# Patient Record
Sex: Female | Born: 1945 | Race: White | Hispanic: No | Smoking: Never smoker
Health system: Southern US, Community
[De-identification: ages and names within clinical notes are randomized; demographics above are authoritative.]

## PROBLEM LIST (undated history)

## (undated) DIAGNOSIS — R2689 Other abnormalities of gait and mobility: Secondary | ICD-10-CM

## (undated) DIAGNOSIS — K635 Polyp of colon: Secondary | ICD-10-CM

## (undated) DIAGNOSIS — C801 Malignant (primary) neoplasm, unspecified: Secondary | ICD-10-CM

## (undated) DIAGNOSIS — C4491 Basal cell carcinoma of skin, unspecified: Secondary | ICD-10-CM

## (undated) DIAGNOSIS — M19049 Primary osteoarthritis, unspecified hand: Secondary | ICD-10-CM

## (undated) DIAGNOSIS — E7800 Pure hypercholesterolemia, unspecified: Secondary | ICD-10-CM

## (undated) DIAGNOSIS — F419 Anxiety disorder, unspecified: Secondary | ICD-10-CM

## (undated) DIAGNOSIS — D509 Iron deficiency anemia, unspecified: Secondary | ICD-10-CM

## (undated) DIAGNOSIS — E669 Obesity, unspecified: Secondary | ICD-10-CM

## (undated) DIAGNOSIS — M1990 Unspecified osteoarthritis, unspecified site: Secondary | ICD-10-CM

## (undated) DIAGNOSIS — H9190 Unspecified hearing loss, unspecified ear: Secondary | ICD-10-CM

## (undated) DIAGNOSIS — K219 Gastro-esophageal reflux disease without esophagitis: Secondary | ICD-10-CM

## (undated) DIAGNOSIS — D649 Anemia, unspecified: Secondary | ICD-10-CM

## (undated) DIAGNOSIS — R011 Cardiac murmur, unspecified: Secondary | ICD-10-CM

## (undated) DIAGNOSIS — E559 Vitamin D deficiency, unspecified: Secondary | ICD-10-CM

## (undated) DIAGNOSIS — I10 Essential (primary) hypertension: Secondary | ICD-10-CM

## (undated) DIAGNOSIS — E119 Type 2 diabetes mellitus without complications: Secondary | ICD-10-CM

## (undated) DIAGNOSIS — E785 Hyperlipidemia, unspecified: Secondary | ICD-10-CM

## (undated) HISTORY — DX: Other abnormalities of gait and mobility: R26.89

## (undated) HISTORY — DX: Anemia, unspecified: D64.9

## (undated) HISTORY — DX: Iron deficiency anemia, unspecified: D50.9

## (undated) HISTORY — DX: Vitamin D deficiency, unspecified: E55.9

## (undated) HISTORY — DX: Pure hypercholesterolemia, unspecified: E78.00

## (undated) HISTORY — DX: Unspecified hearing loss, unspecified ear: H91.90

## (undated) HISTORY — DX: Malignant (primary) neoplasm, unspecified: C80.1

## (undated) HISTORY — DX: Primary osteoarthritis, unspecified hand: M19.049

## (undated) HISTORY — DX: Hyperlipidemia, unspecified: E78.5

## (undated) HISTORY — DX: Basal cell carcinoma of skin, unspecified: C44.91

## (undated) HISTORY — DX: Cardiac murmur, unspecified: R01.1

## (undated) HISTORY — DX: Obesity, unspecified: E66.9

## (undated) HISTORY — PX: OTHER SURGICAL HISTORY: SHX169

## (undated) HISTORY — PX: BREAST SURGERY: SHX581

## (undated) HISTORY — PX: ABDOMINAL HYSTERECTOMY: SHX81

## (undated) HISTORY — DX: Unspecified osteoarthritis, unspecified site: M19.90

## (undated) HISTORY — PX: APPENDECTOMY: SHX54

## (undated) HISTORY — DX: Anxiety disorder, unspecified: F41.9

## (undated) HISTORY — DX: Polyp of colon: K63.5

---

## 1975-12-29 HISTORY — PX: TOTAL ABDOMINAL HYSTERECTOMY: SHX209

## 1979-12-29 HISTORY — PX: BREAST CYST EXCISION: SHX579

## 2000-03-04 ENCOUNTER — Encounter: Payer: Self-pay | Admitting: Family Medicine

## 2000-03-04 ENCOUNTER — Encounter: Admission: RE | Admit: 2000-03-04 | Discharge: 2000-03-04 | Payer: Self-pay | Admitting: Family Medicine

## 2000-03-12 ENCOUNTER — Encounter: Payer: Self-pay | Admitting: Family Medicine

## 2000-03-12 ENCOUNTER — Encounter: Admission: RE | Admit: 2000-03-12 | Discharge: 2000-03-12 | Payer: Self-pay | Admitting: Family Medicine

## 2000-08-31 ENCOUNTER — Encounter: Admission: RE | Admit: 2000-08-31 | Discharge: 2000-08-31 | Payer: Self-pay | Admitting: Family Medicine

## 2000-08-31 ENCOUNTER — Encounter: Payer: Self-pay | Admitting: Family Medicine

## 2001-03-16 ENCOUNTER — Encounter: Payer: Self-pay | Admitting: Family Medicine

## 2001-03-16 ENCOUNTER — Encounter: Admission: RE | Admit: 2001-03-16 | Discharge: 2001-03-16 | Payer: Self-pay | Admitting: Family Medicine

## 2002-03-17 ENCOUNTER — Encounter: Payer: Self-pay | Admitting: Family Medicine

## 2002-03-17 ENCOUNTER — Encounter: Admission: RE | Admit: 2002-03-17 | Discharge: 2002-03-17 | Payer: Self-pay | Admitting: Family Medicine

## 2003-04-09 ENCOUNTER — Encounter: Admission: RE | Admit: 2003-04-09 | Discharge: 2003-04-09 | Payer: Self-pay | Admitting: Family Medicine

## 2003-04-09 ENCOUNTER — Encounter: Payer: Self-pay | Admitting: Family Medicine

## 2003-04-11 ENCOUNTER — Encounter: Payer: Self-pay | Admitting: Family Medicine

## 2003-04-11 ENCOUNTER — Encounter: Admission: RE | Admit: 2003-04-11 | Discharge: 2003-04-11 | Payer: Self-pay | Admitting: Family Medicine

## 2003-12-24 ENCOUNTER — Ambulatory Visit (HOSPITAL_COMMUNITY): Admission: RE | Admit: 2003-12-24 | Discharge: 2003-12-24 | Payer: Self-pay | Admitting: Gastroenterology

## 2003-12-24 ENCOUNTER — Encounter (INDEPENDENT_AMBULATORY_CARE_PROVIDER_SITE_OTHER): Payer: Self-pay | Admitting: *Deleted

## 2004-06-03 ENCOUNTER — Encounter: Admission: RE | Admit: 2004-06-03 | Discharge: 2004-06-03 | Payer: Self-pay | Admitting: Family Medicine

## 2005-06-18 ENCOUNTER — Encounter: Admission: RE | Admit: 2005-06-18 | Discharge: 2005-06-18 | Payer: Self-pay | Admitting: Family Medicine

## 2006-06-21 ENCOUNTER — Encounter: Admission: RE | Admit: 2006-06-21 | Discharge: 2006-06-21 | Payer: Self-pay | Admitting: Family Medicine

## 2007-06-23 ENCOUNTER — Encounter: Admission: RE | Admit: 2007-06-23 | Discharge: 2007-06-23 | Payer: Self-pay | Admitting: Family Medicine

## 2008-03-05 ENCOUNTER — Encounter: Admission: RE | Admit: 2008-03-05 | Discharge: 2008-03-05 | Payer: Self-pay | Admitting: Family Medicine

## 2008-06-25 ENCOUNTER — Encounter: Admission: RE | Admit: 2008-06-25 | Discharge: 2008-06-25 | Payer: Self-pay | Admitting: Family Medicine

## 2008-11-30 ENCOUNTER — Encounter: Admission: RE | Admit: 2008-11-30 | Discharge: 2008-11-30 | Payer: Self-pay | Admitting: Family Medicine

## 2009-06-26 ENCOUNTER — Encounter: Admission: RE | Admit: 2009-06-26 | Discharge: 2009-06-26 | Payer: Self-pay | Admitting: Family Medicine

## 2009-12-28 HISTORY — PX: KNEE SURGERY: SHX244

## 2010-06-27 ENCOUNTER — Encounter: Admission: RE | Admit: 2010-06-27 | Discharge: 2010-06-27 | Payer: Self-pay | Admitting: Family Medicine

## 2010-08-22 ENCOUNTER — Encounter: Admission: RE | Admit: 2010-08-22 | Discharge: 2010-08-22 | Payer: Self-pay | Admitting: Family Medicine

## 2011-05-15 NOTE — Op Note (Signed)
NAME:  ARTIA, SINGLEY                             ACCOUNT NO.:  [id number]   MEDICAL RECORD NO.:  [id number]                   PATIENT TYPE:  AMB   LOCATION:  ENDO                                 FACILITY:  [facility]   PHYSICIAN:  Petra Kuba, M.D.                 DATE OF BIRTH:  1946-10-25   DATE OF PROCEDURE:  12/24/2003  DATE OF DISCHARGE:                                 OPERATIVE REPORT   PROCEDURE:  Colonoscopy with polypectomy.   ENDOSCOPIST:  Petra Kuba, M.D.   INDICATIONS FOR PROCEDURE:  A family history of colon polyps.  Due for a  colonic screening.  A consent was signed after the risks, benefits, methods  and options were thoroughly discussed in the office.   MEDICINES USED:  Demerol 70 mg, Versed 5 mg.   DESCRIPTION OF PROCEDURE:  A rectal inspection is pertinent for external  hemorrhoids, small.  A digital examination was negative.  A video pediatric  adjustable colonoscope was inserted and fairly easily advanced around the  colon to the cecum.  This did require rolling her on her back and some  abdominal pressure.  On insertion no obvious abnormality was seen.  The  cecum was identified by the appendiceal orifice and the ileocecal valve.  The prep was adequate.  There was some liquid stool that required washing  and suctioning.  On a slow withdrawal through the colon, there were three  transverse and three descending small polyps, which were all hot biopsied x1  or 2, and put in the same container.  There were some scattered left-sided  diverticula, but no other abnormalities, as we slowly withdrew back to the  rectum.  An anal rectal pull-through in retroflexion confirmed some small  hemorrhoids.  The scope was reinserted a short ways up the left side of the  colon.  Air and water were suctioned.  The scope was removed.  The patient tolerated the procedure well.  There was no obvious immediate  complication.   ENDOSCOPIC ASSESSMENT:  1. Internal and external  hemorrhoids.  2. Left-sided diverticula.  3. Six small descending and transverse polyps, all hot biopsied 1-2x.  4. Otherwise within normal limits to the cecum.   PLAN:  Await pathology to determine future colonic screening.  Have to see  back p.r.n., otherwise return care to Dr. Kevan Ny for the customary health  care maintenance, to include yearly rectals and guaiacs.                                               Petra Kuba, M.D.    MEM/MEDQ  D:  12/24/2003  T:  12/24/2003  Job:  [id number]   cc:   Dr. Kevan Ny

## 2011-07-20 ENCOUNTER — Other Ambulatory Visit: Payer: Self-pay | Admitting: Family Medicine

## 2011-07-20 DIAGNOSIS — Z1231 Encounter for screening mammogram for malignant neoplasm of breast: Secondary | ICD-10-CM

## 2011-07-24 ENCOUNTER — Ambulatory Visit
Admission: RE | Admit: 2011-07-24 | Discharge: 2011-07-24 | Disposition: A | Discharge status: Home / Self Care | Payer: BC Managed Care – PPO | Source: Ambulatory Visit | Attending: Family Medicine | Admitting: Family Medicine

## 2011-07-24 DIAGNOSIS — Z1231 Encounter for screening mammogram for malignant neoplasm of breast: Secondary | ICD-10-CM

## 2011-09-01 ENCOUNTER — Other Ambulatory Visit: Payer: Self-pay | Admitting: Gastroenterology

## 2012-07-12 ENCOUNTER — Other Ambulatory Visit: Payer: Self-pay | Admitting: Family Medicine

## 2012-07-12 DIAGNOSIS — Z1231 Encounter for screening mammogram for malignant neoplasm of breast: Secondary | ICD-10-CM

## 2012-07-15 ENCOUNTER — Ambulatory Visit
Admission: RE | Admit: 2012-07-15 | Discharge: 2012-07-15 | Disposition: A | Discharge status: Home / Self Care | Payer: Medicare Other | Source: Ambulatory Visit | Attending: Family Medicine | Admitting: Family Medicine

## 2012-07-15 DIAGNOSIS — Z1231 Encounter for screening mammogram for malignant neoplasm of breast: Secondary | ICD-10-CM

## 2012-11-09 NOTE — H&P (Signed)
TOTAL KNEE ADMISSION H&P  Patient is being admitted for left total knee arthroplasty.  Subjective:  Chief Complaint:left knee pain.  HPI: Margaret Zhang, 66 y.o. female, has a history of pain and functional disability in the left knee due to arthritis and has failed non-surgical conservative treatments for greater than 12 weeks to includeNSAID's and/or analgesics, corticosteriod injections, viscosupplementation injections, flexibility and strengthening excercises and activity modification.  Onset of symptoms was gradual, starting 3 years ago with gradually worsening course since that time. The patient noted prior procedures on the knee to include  arthroscopy and menisectomy on the left knee(s).  Patient currently rates pain in the left knee(s) at 7 out of 10 with activity. Patient has night pain, worsening of pain with activity and weight bearing, pain that interferes with activities of daily living, pain with passive range of motion, crepitus and joint swelling.  Patient has evidence of periarticular osteophytes and joint space narrowing by imaging studies.  There is no active infection.  Medical History Tinnitus Vertigo Pneumonia Hypertension Hypercholesteremia Hiatal hernia Osteoarthritis Diabetes mellitus type II  Surgical History Lumpectomy right breast 1981 Hysterectomy with appendectomy 1978 Left knee arthroscopy 2011  Medications Vivelle dot 0.05mg  patch change twice a week Multi-vitamin for women 50+ daily Fish oil 1000mg  TID Lisinopril-HCTZ 20-25mg  daily Amolodipine besylate 5mg  daily Atorvastatin 80mg  daily Vitamin D2 1.25mg  weekly Metformin 500mg  TID  NKDA  History  Substance Use Topics  . Smoking status: Never  . Smokeless tobacco: None  . Alcohol Use: None   Family History Father deceased age 26 due to lung cancer Mother living age 87- HTN  Review of Systems  Constitutional: Positive for malaise/fatigue. Negative for fever, chills, weight loss and  diaphoresis.  HENT: Positive for tinnitus. Negative for hearing loss, ear pain, nosebleeds, congestion, sore throat, neck pain and ear discharge.   Eyes: Negative.   Respiratory: Negative.  Negative for stridor.   Cardiovascular: Negative.   Gastrointestinal: Negative.   Genitourinary: Negative.   Musculoskeletal: Positive for joint pain. Negative for myalgias, back pain and falls.       Left knee pain  Skin: Negative.   Neurological: Negative.  Negative for weakness and headaches.  Endo/Heme/Allergies: Negative.   Psychiatric/Behavioral: Negative.     Objective:  Physical Exam  Constitutional: She is oriented to person, place, and time. She appears well-developed and well-nourished. No distress.  HENT:  Head: Normocephalic and atraumatic.  Right Ear: External ear normal.  Left Ear: External ear normal.  Nose: Nose normal.  Mouth/Throat: Oropharynx is clear and moist.  Eyes: Conjunctivae normal and EOM are normal.  Neck: Normal range of motion. No tracheal deviation present. No thyromegaly present.  Cardiovascular: Normal rate, regular rhythm and intact distal pulses.   No murmur heard. Respiratory: Effort normal and breath sounds normal. No respiratory distress. She has no wheezes. She exhibits no tenderness.  GI: Soft. Bowel sounds are normal. She exhibits no distension and no mass. There is no tenderness.  Musculoskeletal:       Right hip: Normal.       Left hip: Normal.       Right knee: Normal.       Left knee: She exhibits decreased range of motion and swelling. She exhibits no erythema. tenderness found. Medial joint line tenderness noted.       Legs: Lymphadenopathy:    She has no cervical adenopathy.  Neurological: She is alert and oriented to person, place, and time. She has normal strength. No sensory deficit.  Skin: No rash noted. She is not diaphoretic. No erythema.  Psychiatric: She has a normal mood and affect.    Vitals Weight: 198 lb Height: 62  in Body Surface Area: 1.98 m Body Mass Index: 36.21 kg/m Pulse: 65 (Regular) BP: 139/67 (Sitting, Left Arm, Standard)   Imaging Review Plain radiographs demonstrate severe degenerative joint disease of the left knee(s). The overall alignment ismild varus. The bone quality appears to be fair for age and reported activity level.  Assessment/Plan:  End stage arthritis, left knee   The patient history, physical examination, clinical judgment of the provider and imaging studies are consistent with end stage degenerative joint disease of the left knee(s) and total knee arthroplasty is deemed medically necessary. The treatment options including medical management, injection therapy arthroscopy and arthroplasty were discussed at length. The risks and benefits of total knee arthroplasty were presented and reviewed. The risks due to aseptic loosening, infection, stiffness, patella tracking problems, thromboembolic complications and other imponderables were discussed. The patient acknowledged the explanation, agreed to proceed with the plan and consent was signed. Patient is being admitted for inpatient treatment for surgery, pain control, PT, OT, prophylactic antibiotics, VTE prophylaxis, progressive ambulation and ADL's and discharge planning. The patient is planning to be discharged home with home health services    [place], [place]

## 2012-11-15 ENCOUNTER — Encounter (HOSPITAL_COMMUNITY): Payer: Self-pay | Admitting: Pharmacy Technician

## 2012-11-16 ENCOUNTER — Other Ambulatory Visit (HOSPITAL_COMMUNITY): Payer: Self-pay | Admitting: Surgical

## 2012-11-16 ENCOUNTER — Other Ambulatory Visit (HOSPITAL_COMMUNITY): Payer: Self-pay | Admitting: Orthopedic Surgery

## 2012-11-17 ENCOUNTER — Encounter (HOSPITAL_COMMUNITY)
Admission: RE | Admit: 2012-11-17 | Discharge: 2012-11-17 | Disposition: A | Discharge status: Home / Self Care | Payer: Medicare Other | Source: Ambulatory Visit | Attending: Orthopedic Surgery | Admitting: Orthopedic Surgery

## 2012-11-17 ENCOUNTER — Encounter (HOSPITAL_COMMUNITY): Payer: Self-pay

## 2012-11-17 ENCOUNTER — Ambulatory Visit (HOSPITAL_COMMUNITY)
Admission: RE | Admit: 2012-11-17 | Discharge: 2012-11-17 | Disposition: A | Discharge status: Home / Self Care | Payer: Medicare Other | Source: Ambulatory Visit | Attending: Surgical | Admitting: Surgical

## 2012-11-17 DIAGNOSIS — Z01812 Encounter for preprocedural laboratory examination: Secondary | ICD-10-CM | POA: Insufficient documentation

## 2012-11-17 DIAGNOSIS — Z01818 Encounter for other preprocedural examination: Secondary | ICD-10-CM | POA: Insufficient documentation

## 2012-11-17 HISTORY — DX: Type 2 diabetes mellitus without complications: E11.9

## 2012-11-17 HISTORY — DX: Gastro-esophageal reflux disease without esophagitis: K21.9

## 2012-11-17 HISTORY — DX: Essential (primary) hypertension: I10

## 2012-11-17 LAB — URINALYSIS, ROUTINE W REFLEX MICROSCOPIC
Bilirubin Urine: NEGATIVE
Glucose, UA: NEGATIVE mg/dL
Hgb urine dipstick: NEGATIVE
Ketones, ur: NEGATIVE mg/dL
Leukocytes, UA: NEGATIVE
Nitrite: NEGATIVE
Protein, ur: NEGATIVE mg/dL
Specific Gravity, Urine: 1.022 (ref 1.005–1.030)
Urobilinogen, UA: 0.2 mg/dL (ref 0.0–1.0)
pH: 5 (ref 5.0–8.0)

## 2012-11-17 LAB — COMPREHENSIVE METABOLIC PANEL
ALT: 19 U/L (ref 0–35)
AST: 19 U/L (ref 0–37)
Albumin: 3.3 g/dL — ABNORMAL LOW (ref 3.5–5.2)
Alkaline Phosphatase: 72 U/L (ref 39–117)
BUN: 13 mg/dL (ref 6–23)
CO2: 27 mEq/L (ref 19–32)
Calcium: 9.4 mg/dL (ref 8.4–10.5)
Chloride: 103 mEq/L (ref 96–112)
Creatinine, Ser: 0.58 mg/dL (ref 0.50–1.10)
GFR calc Af Amer: >90 mL/min (ref >90)
GFR calc non Af Amer: >90 mL/min (ref >90)
Glucose, Bld: 133 mg/dL — ABNORMAL HIGH (ref 70–99)
Potassium: 4.2 mEq/L (ref 3.5–5.1)
Sodium: 139 mEq/L (ref 135–145)
Total Bilirubin: 0.3 mg/dL (ref 0.3–1.2)
Total Protein: 6.5 g/dL (ref 6.0–8.3)

## 2012-11-17 LAB — CBC
Hemoglobin: 11.7 g/dL — ABNORMAL LOW (ref 12.0–15.0)
MCHC: 32.5 g/dL (ref 30.0–36.0)
RBC: 4.28 MIL/uL (ref 3.87–5.11)

## 2012-11-17 LAB — PROTIME-INR
INR: 0.91 (ref 0.00–1.49)
Prothrombin Time: 12.2 seconds (ref 11.6–15.2)

## 2012-11-17 LAB — SURGICAL PCR SCREEN
MRSA, PCR: NEGATIVE
Staphylococcus aureus: NEGATIVE

## 2012-11-17 LAB — APTT: aPTT: 36 seconds (ref 24–37)

## 2012-11-17 NOTE — Patient Instructions (Signed)
20      Your procedure is scheduled on:  Wednesday 11/23/2012 at 0730 am  Report to [facility] at 0530 AM.  Call this number if you have problems the morning of surgery: [phone]   Remember:   Do not eat food or drink liquids after midnight!  Take these medicines the morning of surgery with A SIP OF WATER: Norvasc, Prilosec   Do not bring valuables to the hospital.  .  Leave suitcase in the car. After surgery it may be brought to your room.  For patients admitted to the hospital, checkout time is 11:00 AM the day of              Discharge.    Special Instructions: See [facility] Preparing  For Surgery Instruction Sheet. Do not wear jewelry, lotions powders, perfumes. Women do not shave legs or underarms for 12 hours before showers. Contacts, partial plates, or dentures may not be worn into surgery.                          Patients discharged the day of surgery will not be allowed to drive home. If going home the same day of surgery, must have someone stay with you                first 24 hrs.at home and arrange for someone to drive you home from the              Hospital. YOUR DRIVER IS: Son- Jomarie Longs   Please read over the following fact sheets that you were given: MRSA INFORMATION,INCENTIVE SPIROMETRY SHEET, SLEEP APNEA SHEET, BLOOD TRANSFUSION SHEET                            Telford Nab.Dearius Hoffmann,RN,BSN     [phone]

## 2012-11-22 NOTE — Anesthesia Preprocedure Evaluation (Addendum)
Anesthesia Evaluation  Patient identified by MRN, date of birth, ID band Patient awake    Reviewed: Allergy & Precautions, H&P , NPO status , Patient's Chart, lab work & pertinent test results  Airway Mallampati: II TM Distance: >3 FB Neck ROM: full    Dental No notable dental hx. (+) Teeth Intact and Dental Advisory Given   Pulmonary neg pulmonary ROS,  breath sounds clear to auscultation  Pulmonary exam normal       Cardiovascular Exercise Tolerance: Good hypertension, Pt. on medications Rhythm:regular Rate:Normal     Neuro/Psych negative neurological ROS  negative psych ROS   GI/Hepatic negative GI ROS, Neg liver ROS, GERD-  Medicated and Controlled,  Endo/Other  diabetes, Well Controlled, Type 2, Oral Hypoglycemic Agents  Renal/GU negative Renal ROS  negative genitourinary   Musculoskeletal   Abdominal   Peds  Hematology negative hematology ROS (+)   Anesthesia Other Findings   Reproductive/Obstetrics negative OB ROS                          Anesthesia Physical Anesthesia Plan  ASA: III  Anesthesia Plan: General   Post-op Pain Management:    Induction: Intravenous  Airway Management Planned: Oral ETT  Additional Equipment:   Intra-op Plan:   Post-operative Plan: Extubation in OR  Informed Consent: I have reviewed the patients History and Physical, chart, labs and discussed the procedure including the risks, benefits and alternatives for the proposed anesthesia with the patient or authorized representative who has indicated his/her understanding and acceptance.   Dental Advisory Given  Plan Discussed with: CRNA and Surgeon  Anesthesia Plan Comments:         Anesthesia Quick Evaluation  

## 2012-11-23 ENCOUNTER — Ambulatory Visit (HOSPITAL_COMMUNITY): Payer: Medicare Other | Admitting: Anesthesiology

## 2012-11-23 ENCOUNTER — Inpatient Hospital Stay (HOSPITAL_COMMUNITY)
Admission: RE | Admit: 2012-11-23 | Discharge: 2012-11-28 | DRG: 470 | Disposition: A | Discharge status: Skilled Nursing Facility | Payer: Medicare Other | Source: Ambulatory Visit | Attending: Orthopedic Surgery | Admitting: Orthopedic Surgery

## 2012-11-23 ENCOUNTER — Encounter (HOSPITAL_COMMUNITY)
Admission: RE | Disposition: A | Discharge status: Skilled Nursing Facility | Payer: Self-pay | Source: Ambulatory Visit | Attending: Orthopedic Surgery

## 2012-11-23 ENCOUNTER — Encounter (HOSPITAL_COMMUNITY): Payer: Self-pay | Admitting: *Deleted

## 2012-11-23 ENCOUNTER — Encounter (HOSPITAL_COMMUNITY): Payer: Self-pay | Admitting: Anesthesiology

## 2012-11-23 ENCOUNTER — Ambulatory Visit (HOSPITAL_COMMUNITY): Payer: Medicare Other

## 2012-11-23 DIAGNOSIS — Z79899 Other long term (current) drug therapy: Secondary | ICD-10-CM

## 2012-11-23 DIAGNOSIS — E119 Type 2 diabetes mellitus without complications: Secondary | ICD-10-CM | POA: Diagnosis present

## 2012-11-23 DIAGNOSIS — E871 Hypo-osmolality and hyponatremia: Secondary | ICD-10-CM

## 2012-11-23 DIAGNOSIS — I10 Essential (primary) hypertension: Secondary | ICD-10-CM | POA: Diagnosis present

## 2012-11-23 DIAGNOSIS — K219 Gastro-esophageal reflux disease without esophagitis: Secondary | ICD-10-CM | POA: Diagnosis present

## 2012-11-23 DIAGNOSIS — E876 Hypokalemia: Secondary | ICD-10-CM

## 2012-11-23 DIAGNOSIS — M1710 Unilateral primary osteoarthritis, unspecified knee: Principal | ICD-10-CM | POA: Diagnosis present

## 2012-11-23 DIAGNOSIS — M179 Osteoarthritis of knee, unspecified: Secondary | ICD-10-CM | POA: Diagnosis present

## 2012-11-23 DIAGNOSIS — D62 Acute posthemorrhagic anemia: Secondary | ICD-10-CM

## 2012-11-23 DIAGNOSIS — E7800 Pure hypercholesterolemia, unspecified: Secondary | ICD-10-CM | POA: Diagnosis present

## 2012-11-23 HISTORY — PX: TOTAL KNEE ARTHROPLASTY: SHX125

## 2012-11-23 LAB — GLUCOSE, CAPILLARY
Glucose-Capillary: 235 mg/dL — ABNORMAL HIGH (ref 70–99)
Glucose-Capillary: 187 mg/dL — ABNORMAL HIGH (ref 70–99)

## 2012-11-23 LAB — TYPE AND SCREEN
ABO/RH(D): A NEG
Antibody Screen: NEGATIVE

## 2012-11-23 SURGERY — ARTHROPLASTY, KNEE, TOTAL
Anesthesia: General | Site: Knee | Laterality: Left | Wound class: Clean

## 2012-11-23 MED ORDER — MENTHOL 3 MG MT LOZG
1.0000 | LOZENGE | OROMUCOSAL | Status: DC | PRN
  Filled 2012-11-23: qty 9

## 2012-11-23 MED ORDER — ONDANSETRON HCL 4 MG/2ML IJ SOLN
INTRAMUSCULAR | Status: DC | PRN
  Administered 2012-11-23: 4 mg via INTRAVENOUS

## 2012-11-23 MED ORDER — NEOSTIGMINE METHYLSULFATE 1 MG/ML IJ SOLN
INTRAMUSCULAR | Status: DC | PRN
  Administered 2012-11-23: 3 mg via INTRAVENOUS

## 2012-11-23 MED ORDER — ONDANSETRON HCL 4 MG/2ML IJ SOLN
4.0000 mg | Freq: Four times a day (QID) | INTRAMUSCULAR | Status: DC | PRN
  Administered 2012-11-23: 4 mg via INTRAVENOUS
  Filled 2012-11-23: qty 2

## 2012-11-23 MED ORDER — METHOCARBAMOL 500 MG PO TABS
500.0000 mg | ORAL_TABLET | Freq: Four times a day (QID) | ORAL | Status: DC | PRN
  Administered 2012-11-24 – 2012-11-28 (×11): 500 mg via ORAL
  Filled 2012-11-23 (×11): qty 1

## 2012-11-23 MED ORDER — MIDAZOLAM HCL 5 MG/5ML IJ SOLN
INTRAMUSCULAR | Status: DC | PRN
  Administered 2012-11-23: 2 mg via INTRAVENOUS

## 2012-11-23 MED ORDER — HEMOSTATIC AGENTS (NO CHARGE) OPTIME
TOPICAL | Status: DC | PRN
  Administered 2012-11-23: 1 via TOPICAL

## 2012-11-23 MED ORDER — SODIUM CHLORIDE 0.9 % IR SOLN
Status: DC | PRN
  Administered 2012-11-23: 08:00:00

## 2012-11-23 MED ORDER — ACETAMINOPHEN 325 MG PO TABS
650.0000 mg | ORAL_TABLET | Freq: Four times a day (QID) | ORAL | Status: DC | PRN
  Administered 2012-11-24: 650 mg via ORAL
  Filled 2012-11-23 (×2): qty 2

## 2012-11-23 MED ORDER — HYDROMORPHONE HCL PF 1 MG/ML IJ SOLN
1.0000 mg | INTRAMUSCULAR | Status: DC | PRN
  Administered 2012-11-23 – 2012-11-24 (×8): 1 mg via INTRAVENOUS
  Filled 2012-11-23 (×8): qty 1

## 2012-11-23 MED ORDER — OMEPRAZOLE MAGNESIUM 20 MG PO TBEC: 20.0000 mg | DELAYED_RELEASE_TABLET | Freq: Every day | ORAL | Status: DC

## 2012-11-23 MED ORDER — GLYCOPYRROLATE 0.2 MG/ML IJ SOLN
INTRAMUSCULAR | Status: DC | PRN
  Administered 2012-11-23: 0.4 mg via INTRAVENOUS

## 2012-11-23 MED ORDER — SODIUM CHLORIDE 0.9 % IJ SOLN
INTRAMUSCULAR | Status: DC | PRN
  Administered 2012-11-23: 20 mL

## 2012-11-23 MED ORDER — ACETAMINOPHEN 10 MG/ML IV SOLN
INTRAVENOUS | Status: AC
  Filled 2012-11-23: qty 100

## 2012-11-23 MED ORDER — PANTOPRAZOLE SODIUM 40 MG PO TBEC
40.0000 mg | DELAYED_RELEASE_TABLET | Freq: Every day | ORAL | Status: DC
  Administered 2012-11-23 – 2012-11-28 (×6): 40 mg via ORAL
  Filled 2012-11-23 (×6): qty 1

## 2012-11-23 MED ORDER — THROMBIN 5000 UNITS EX SOLR
CUTANEOUS | Status: DC | PRN
  Administered 2012-11-23: 5000 [IU] via TOPICAL

## 2012-11-23 MED ORDER — ALUM & MAG HYDROXIDE-SIMETH 200-200-20 MG/5ML PO SUSP: 30.0000 mL | ORAL | Status: DC | PRN

## 2012-11-23 MED ORDER — INSULIN ASPART 100 UNIT/ML ~~LOC~~ SOLN
0.0000 [IU] | Freq: Three times a day (TID) | SUBCUTANEOUS | Status: DC
  Administered 2012-11-23: 5 [IU] via SUBCUTANEOUS
  Administered 2012-11-24 (×3): 3 [IU] via SUBCUTANEOUS
  Administered 2012-11-25 – 2012-11-26 (×5): 2 [IU] via SUBCUTANEOUS
  Administered 2012-11-27: 3 [IU] via SUBCUTANEOUS
  Administered 2012-11-27: 2 [IU] via SUBCUTANEOUS
  Administered 2012-11-27: 3 [IU] via SUBCUTANEOUS
  Administered 2012-11-28 (×2): 2 [IU] via SUBCUTANEOUS

## 2012-11-23 MED ORDER — CEFAZOLIN SODIUM 1-5 GM-% IV SOLN
1.0000 g | Freq: Four times a day (QID) | INTRAVENOUS | Status: AC
  Administered 2012-11-23 (×2): 1 g via INTRAVENOUS
  Filled 2012-11-23 (×4): qty 50

## 2012-11-23 MED ORDER — CEFAZOLIN SODIUM-DEXTROSE 2-3 GM-% IV SOLR
INTRAVENOUS | Status: AC
  Filled 2012-11-23: qty 50

## 2012-11-23 MED ORDER — DEXAMETHASONE SODIUM PHOSPHATE 10 MG/ML IJ SOLN
INTRAMUSCULAR | Status: DC | PRN
  Administered 2012-11-23: 10 mg via INTRAVENOUS

## 2012-11-23 MED ORDER — VITAMINS A & D EX OINT
TOPICAL_OINTMENT | CUTANEOUS | Status: AC
  Administered 2012-11-23: 5
  Filled 2012-11-23: qty 5

## 2012-11-23 MED ORDER — LACTATED RINGERS IV SOLN
INTRAVENOUS | Status: DC
  Administered 2012-11-23 – 2012-11-24 (×2): via INTRAVENOUS

## 2012-11-23 MED ORDER — HYDROCODONE-ACETAMINOPHEN 5-325 MG PO TABS
1.0000 | ORAL_TABLET | ORAL | Status: DC | PRN
  Administered 2012-11-24: 2 via ORAL
  Filled 2012-11-23: qty 2
  Filled 2012-11-23: qty 1

## 2012-11-23 MED ORDER — HYDROMORPHONE HCL PF 1 MG/ML IJ SOLN: 0.2500 mg | INTRAMUSCULAR | Status: DC | PRN

## 2012-11-23 MED ORDER — LACTATED RINGERS IV SOLN
INTRAVENOUS | Status: DC | PRN
  Administered 2012-11-23 (×2): via INTRAVENOUS

## 2012-11-23 MED ORDER — THROMBIN 5000 UNITS EX SOLR
CUTANEOUS | Status: AC
  Filled 2012-11-23: qty 5000

## 2012-11-23 MED ORDER — SODIUM CHLORIDE 0.9 % IR SOLN
Status: DC | PRN
  Administered 2012-11-23: 3000 mL

## 2012-11-23 MED ORDER — OXYCODONE-ACETAMINOPHEN 5-325 MG PO TABS
2.0000 | ORAL_TABLET | ORAL | Status: DC | PRN
  Administered 2012-11-24 (×2): 2 via ORAL
  Filled 2012-11-23 (×3): qty 2

## 2012-11-23 MED ORDER — AMLODIPINE BESYLATE 5 MG PO TABS
5.0000 mg | ORAL_TABLET | Freq: Every day | ORAL | Status: DC
  Administered 2012-11-24 – 2012-11-28 (×3): 5 mg via ORAL
  Filled 2012-11-23 (×6): qty 1

## 2012-11-23 MED ORDER — HYDROMORPHONE HCL PF 1 MG/ML IJ SOLN
INTRAMUSCULAR | Status: DC | PRN
  Administered 2012-11-23: 2 mg via INTRAVENOUS

## 2012-11-23 MED ORDER — RIVAROXABAN 10 MG PO TABS
10.0000 mg | ORAL_TABLET | Freq: Every day | ORAL | Status: DC
  Administered 2012-11-24 – 2012-11-28 (×5): 10 mg via ORAL
  Filled 2012-11-23 (×7): qty 1

## 2012-11-23 MED ORDER — LACTATED RINGERS IV SOLN: INTRAVENOUS | Status: DC

## 2012-11-23 MED ORDER — METFORMIN HCL 500 MG PO TABS
1000.0000 mg | ORAL_TABLET | Freq: Every day | ORAL | Status: DC
  Administered 2012-11-24 – 2012-11-28 (×5): 1000 mg via ORAL
  Filled 2012-11-23 (×6): qty 2

## 2012-11-23 MED ORDER — BISACODYL 10 MG RE SUPP: 10.0000 mg | Freq: Every day | RECTAL | Status: DC | PRN

## 2012-11-23 MED ORDER — LIDOCAINE HCL (CARDIAC) 20 MG/ML IV SOLN
INTRAVENOUS | Status: DC | PRN
  Administered 2012-11-23: 50 mg via INTRAVENOUS

## 2012-11-23 MED ORDER — METHOCARBAMOL 100 MG/ML IJ SOLN
500.0000 mg | Freq: Four times a day (QID) | INTRAVENOUS | Status: DC | PRN
  Administered 2012-11-23: 500 mg via INTRAVENOUS
  Filled 2012-11-23: qty 5

## 2012-11-23 MED ORDER — FERROUS SULFATE 325 (65 FE) MG PO TABS
325.0000 mg | ORAL_TABLET | Freq: Three times a day (TID) | ORAL | Status: DC
  Administered 2012-11-23 – 2012-11-27 (×11): 325 mg via ORAL
  Filled 2012-11-23 (×18): qty 1

## 2012-11-23 MED ORDER — POLYETHYLENE GLYCOL 3350 17 G PO PACK: 17.0000 g | PACK | Freq: Every day | ORAL | Status: DC | PRN

## 2012-11-23 MED ORDER — BUPIVACAINE LIPOSOME 1.3 % IJ SUSP
20.0000 mL | Freq: Once | INTRAMUSCULAR | Status: AC
  Administered 2012-11-23: 20 mL
  Filled 2012-11-23: qty 20

## 2012-11-23 MED ORDER — METFORMIN HCL 500 MG PO TABS
500.0000 mg | ORAL_TABLET | Freq: Every day | ORAL | Status: DC
  Administered 2012-11-23 – 2012-11-27 (×5): 500 mg via ORAL
  Filled 2012-11-23 (×6): qty 1

## 2012-11-23 MED ORDER — SUCCINYLCHOLINE CHLORIDE 20 MG/ML IJ SOLN
INTRAMUSCULAR | Status: DC | PRN
  Administered 2012-11-23: 100 mg via INTRAVENOUS

## 2012-11-23 MED ORDER — CEFAZOLIN SODIUM-DEXTROSE 2-3 GM-% IV SOLR
2.0000 g | INTRAVENOUS | Status: AC
  Administered 2012-11-23: 2 g via INTRAVENOUS

## 2012-11-23 MED ORDER — HYDROCHLOROTHIAZIDE 25 MG PO TABS
25.0000 mg | ORAL_TABLET | Freq: Every day | ORAL | Status: DC
  Administered 2012-11-23 – 2012-11-28 (×4): 25 mg via ORAL
  Filled 2012-11-23 (×6): qty 1

## 2012-11-23 MED ORDER — PROPOFOL 10 MG/ML IV BOLUS
INTRAVENOUS | Status: DC | PRN
  Administered 2012-11-23: 170 mg via INTRAVENOUS

## 2012-11-23 MED ORDER — ESTRADIOL 0.05 MG/24HR TD PTWK
0.0500 mg | MEDICATED_PATCH | TRANSDERMAL | Status: DC
  Administered 2012-11-24: 0.05 mg via TRANSDERMAL
  Filled 2012-11-23: qty 1

## 2012-11-23 MED ORDER — ATORVASTATIN CALCIUM 80 MG PO TABS
80.0000 mg | ORAL_TABLET | Freq: Every day | ORAL | Status: DC
  Administered 2012-11-24 – 2012-11-27 (×4): 80 mg via ORAL
  Filled 2012-11-23 (×5): qty 1

## 2012-11-23 MED ORDER — ROCURONIUM BROMIDE 100 MG/10ML IV SOLN
INTRAVENOUS | Status: DC | PRN
  Administered 2012-11-23: 20 mg via INTRAVENOUS

## 2012-11-23 MED ORDER — LISINOPRIL 20 MG PO TABS
20.0000 mg | ORAL_TABLET | Freq: Every day | ORAL | Status: DC
  Administered 2012-11-23 – 2012-11-28 (×3): 20 mg via ORAL
  Filled 2012-11-23 (×6): qty 1

## 2012-11-23 MED ORDER — ONDANSETRON HCL 4 MG PO TABS
4.0000 mg | ORAL_TABLET | Freq: Four times a day (QID) | ORAL | Status: DC | PRN
  Administered 2012-11-27: 4 mg via ORAL
  Filled 2012-11-23: qty 1

## 2012-11-23 MED ORDER — ACETAMINOPHEN 650 MG RE SUPP: 650.0000 mg | Freq: Four times a day (QID) | RECTAL | Status: DC | PRN

## 2012-11-23 MED ORDER — PHENOL 1.4 % MT LIQD
1.0000 | OROMUCOSAL | Status: DC | PRN
  Filled 2012-11-23: qty 177

## 2012-11-23 MED ORDER — LISINOPRIL-HYDROCHLOROTHIAZIDE 20-25 MG PO TABS: 1.0000 | ORAL_TABLET | Freq: Every day | ORAL | Status: DC

## 2012-11-23 MED ORDER — FENTANYL CITRATE 0.05 MG/ML IJ SOLN
INTRAMUSCULAR | Status: DC | PRN
  Administered 2012-11-23: 100 ug via INTRAVENOUS

## 2012-11-23 MED ORDER — ACETAMINOPHEN 10 MG/ML IV SOLN
INTRAVENOUS | Status: DC | PRN
  Administered 2012-11-23: 1000 mg via INTRAVENOUS

## 2012-11-23 MED ORDER — FLEET ENEMA 7-19 GM/118ML RE ENEM: 1.0000 | ENEMA | Freq: Once | RECTAL | Status: AC | PRN

## 2012-11-23 SURGICAL SUPPLY — 67 items
BAG SPEC THK2 15X12 ZIP CLS (MISCELLANEOUS) ×1
BAG ZIPLOCK 12X15 (MISCELLANEOUS) ×2 IMPLANT
BANDAGE ELASTIC 4 VELCRO ST LF (GAUZE/BANDAGES/DRESSINGS) ×2 IMPLANT
BANDAGE ELASTIC 6 VELCRO ST LF (GAUZE/BANDAGES/DRESSINGS) ×2 IMPLANT
BANDAGE ESMARK 6X9 LF (GAUZE/BANDAGES/DRESSINGS) ×1 IMPLANT
BANDAGE GAUZE ELAST BULKY 4 IN (GAUZE/BANDAGES/DRESSINGS) ×2 IMPLANT
BLADE SAG 18X100X1.27 (BLADE) ×2 IMPLANT
BLADE SAW SGTL 11.0X1.19X90.0M (BLADE) ×2 IMPLANT
BNDG CMPR 9X6 STRL LF SNTH (GAUZE/BANDAGES/DRESSINGS) ×1
BNDG ESMARK 6X9 LF (GAUZE/BANDAGES/DRESSINGS) ×2
BONE CEMENT GENTAMICIN (Cement) ×4 IMPLANT
CEMENT BONE GENTAMICIN 40 (Cement) ×2 IMPLANT
CLOTH BEACON ORANGE TIMEOUT ST (SAFETY) ×2 IMPLANT
CUFF TOURN SGL QUICK 34 (TOURNIQUET CUFF) ×2
CUFF TRNQT CYL 34X4X40X1 (TOURNIQUET CUFF) ×1 IMPLANT
DRAPE EXTREMITY T 121X128X90 (DRAPE) ×2 IMPLANT
DRAPE INCISE IOBAN 66X45 STRL (DRAPES) ×2 IMPLANT
DRAPE LG THREE QUARTER DISP (DRAPES) ×2 IMPLANT
DRAPE POUCH INSTRU U-SHP 10X18 (DRAPES) ×2 IMPLANT
DRAPE U-SHAPE 47X51 STRL (DRAPES) ×3 IMPLANT
DRSG ADAPTIC 3X8 NADH LF (GAUZE/BANDAGES/DRESSINGS) ×2 IMPLANT
DRSG PAD ABDOMINAL 8X10 ST (GAUZE/BANDAGES/DRESSINGS) ×5 IMPLANT
DURAPREP 26ML APPLICATOR (WOUND CARE) ×2 IMPLANT
ELECT REM PT RETURN 9FT ADLT (ELECTROSURGICAL) ×2
ELECTRODE REM PT RTRN 9FT ADLT (ELECTROSURGICAL) ×1 IMPLANT
EVACUATOR 1/8 PVC DRAIN (DRAIN) ×2 IMPLANT
FACESHIELD LNG OPTICON STERILE (SAFETY) ×11 IMPLANT
GLOVE BIOGEL PI IND STRL 8 (GLOVE) ×1 IMPLANT
GLOVE BIOGEL PI IND STRL 8.5 (GLOVE) IMPLANT
GLOVE BIOGEL PI INDICATOR 8 (GLOVE) ×1
GLOVE BIOGEL PI INDICATOR 8.5 (GLOVE)
GLOVE ECLIPSE 8.0 STRL XLNG CF (GLOVE) ×4 IMPLANT
GLOVE SURG SS PI 6.5 STRL IVOR (GLOVE) ×5 IMPLANT
GOWN PREVENTION PLUS LG XLONG (DISPOSABLE) ×4 IMPLANT
GOWN STRL REIN XL XLG (GOWN DISPOSABLE) ×3 IMPLANT
HANDPIECE INTERPULSE COAX TIP (DISPOSABLE) ×2
IMMOBILIZER KNEE 20 (SOFTGOODS) ×2
IMMOBILIZER KNEE 20 THIGH 36 (SOFTGOODS) IMPLANT
KIT BASIN OR (CUSTOM PROCEDURE TRAY) ×2 IMPLANT
MANIFOLD NEPTUNE II (INSTRUMENTS) ×2 IMPLANT
NEEDLE HYPO 22GX1.5 SAFETY (NEEDLE) ×2 IMPLANT
NS IRRIG 1000ML POUR BTL (IV SOLUTION) ×2 IMPLANT
PACK TOTAL JOINT (CUSTOM PROCEDURE TRAY) ×2 IMPLANT
POSITIONER SURGICAL ARM (MISCELLANEOUS) ×2 IMPLANT
SET HNDPC FAN SPRY TIP SCT (DISPOSABLE) ×1 IMPLANT
SET PAD KNEE POSITIONER (MISCELLANEOUS) ×2 IMPLANT
SPONGE GAUZE 4X4 12PLY (GAUZE/BANDAGES/DRESSINGS) ×1 IMPLANT
SPONGE LAP 18X18 X RAY DECT (DISPOSABLE) ×2 IMPLANT
SPONGE SURGIFOAM ABS GEL 100 (HEMOSTASIS) ×2 IMPLANT
STAPLER VISISTAT 35W (STAPLE) IMPLANT
STRIP CLOSURE SKIN 1/2X4 (GAUZE/BANDAGES/DRESSINGS) ×1 IMPLANT
SUCTION FRAZIER 12FR DISP (SUCTIONS) ×2 IMPLANT
SUT BONE WAX W31G (SUTURE) ×2 IMPLANT
SUT MNCRL AB 4-0 PS2 18 (SUTURE) ×1 IMPLANT
SUT VIC AB 0 CT1 27 (SUTURE) ×4
SUT VIC AB 0 CT1 27XBRD ANTBC (SUTURE) ×2 IMPLANT
SUT VIC AB 1 CT1 27 (SUTURE) ×6
SUT VIC AB 1 CT1 27XBRD ANTBC (SUTURE) ×5 IMPLANT
SUT VIC AB 2-0 CT1 27 (SUTURE) ×6
SUT VIC AB 2-0 CT1 TAPERPNT 27 (SUTURE) ×3 IMPLANT
SUT VLOC 180 0 24IN GS25 (SUTURE) ×1 IMPLANT
SYR 20CC LL (SYRINGE) ×2 IMPLANT
TOWEL OR 17X26 10 PK STRL BLUE (TOWEL DISPOSABLE) ×4 IMPLANT
TOWER CARTRIDGE SMART MIX (DISPOSABLE) ×2 IMPLANT
TRAY FOLEY CATH 14FRSI W/METER (CATHETERS) ×2 IMPLANT
WATER STERILE IRR 1500ML POUR (IV SOLUTION) ×2 IMPLANT
WRAP KNEE MAXI GEL POST OP (GAUZE/BANDAGES/DRESSINGS) ×3 IMPLANT

## 2012-11-23 NOTE — Brief Op Note (Signed)
11/23/2012  9:53 AM  PATIENT:  Margaret Zhang  66 y.o. female  PRE-OPERATIVE DIAGNOSIS:  osteoarthritis left knee  POST-OPERATIVE DIAGNOSIS:  osteoarthritis left knee  PROCEDURE:  Procedure(s) (LRB) with comments: TOTAL KNEE ARTHROPLASTY (Left)  SURGEON:  Surgeon(s) and Role:    * Jacki Cones, MD - Primary  PHYSICIAN ASSISTANT: Dimitri Ped PA   ASSISTANTS: Dimitri Ped PA  ANESTHESIA:   general  EBL:  Total I/O In: 1700 [I.V.:1700] Out: 100 [Urine:100]  BLOOD ADMINISTERED:none  DRAINS: (one) Hemovact drain(s) in the Left with  Suction Open   LOCAL MEDICATIONS USED:  BUPIVICAINE Mixture of 20cc and 20cc of Normal Saline   SPECIMEN:  No Specimen  DISPOSITION OF SPECIMEN:  N/A  COUNTS:  YES  TOURNIQUET:   Total Tourniquet Time Documented: Thigh (Left) - 81 minutes  DICTATION: .Other Dictation: Dictation Number [id number]  PLAN OF CARE: Admit to inpatient   PATIENT DISPOSITION:  Stable in OR   Delay start of Pharmacological VTE agent (>24hrs) due to surgical blood loss or risk of bleeding: yes

## 2012-11-23 NOTE — Op Note (Signed)
NAMELALANA, WACHTER NO.:  [id number]  MEDICAL RECORD NO.:  [id number]  LOCATION:  1613                         FACILITY:  [facility]  PHYSICIAN:  Georges Lynch. Wylene Weissman, M.D.DATE OF BIRTH:  1946-08-13  DATE OF PROCEDURE:  11/23/2012 DATE OF DISCHARGE:                              OPERATIVE REPORT   SURGEON:  Georges Lynch. Darrelyn Hillock, M.D.  ASSISTANT:  Dimitri Ped, [place]  PREOPERATIVE DIAGNOSES: 1. Severe degenerative arthritis with bone on bone, left knee. 2. Flexion contracture of left knee.  POSTOPERATIVE DIAGNOSES: 1. Severe degenerative arthritis with bone on bone, left knee. 2. Flexion contracture of left knee.  OPERATION:  Left total knee arthroplasty.  Note, I utilized the [employer] system.  Appropriate time-out was carried out prior to making incision.  I also marked the appropriate left leg in the holding area.  At this time, after sterile prepping and draping, I exsanguinated the left lower extremity.  An Esmarch tourniquet was elevated to 325 mmHg.  Left leg was placed in a DeMayo knee holder.  The knee was flexed and incision was made over the anterior aspect of left knee.  Bleeders were identified and cauterized.  I then created two flaps reflecting the patella laterally in usual fashion after carried out to be in median parapatellar approach.  At this time, I then did medial and lateral meniscectomies and excised the anterior and posterior cruciate ligaments.  Following that, I made my initial drill hole in the intercondylar notch.  I removed 12 mm thickness off the distal femur.  I measured the femur to be a size 3 left posterior cruciate sacrificing. I then went down and carried out my intramedullary insertion of the guide rod in the tibia after making the appropriate drill hole in the tibial plateau.  I then removed 8 mm thickness using my baseline at the medial side of the joint.  After making my appropriate cut, I then examined the posterior condyles  and made sure there were no other loose fragments.  Thoroughly irrigated out the knee.  I then inserted my laminar spreaders, reinspected the posterior compartments.  Following that, I then utilized my spacer guides and made sure we had the appropriate tension in flexion and extension, and lateral medial stability as well.  At this particular time, I then cut my keel cut out of the tibial plateau.  I then did my notch cut out of the distal femur in usual fashion.  I then inserted my trial components, reduced the knee and had an excellent fit.  The tibial tray measured to be a size 2-1/2 tibial tray, the insert we had with a 12.5-mm thickness, size 3 insert and the femur was a size 3.  After the trial inserts were inserted, I then kept the knee extended and did a resurfacing procedure on the patella.  Three drill holes were made in the articular surface of the patella in the usual fashion.  Following that, I removed all trial components, thoroughly water picked out the knee, dried the knee out and I did my initial injection of half with a mixture of Exparel and normal saline.  I had 20 mL of  Exparel and 20 mL of normal saline, mixture I used half of that and the soft tissue structures at the beginning, the other half was used at the end of the procedure.  Following that, all three components were cemented in simultaneously.  The trial tibial component was held in place until the cement hardened.  After that, we removed the trial insert and removed all loose pieces of cement.  I then water picked the knee out made sure all loose pieces of cement were removed.  I then inserted some thrombin-soaked Gelfoam in the posterior popliteal region.  I then inserted my permanent size 3 12.5 mm thickness rotating platform, insert, reduced the knee and had excellent stability and excellent motion.  I then inserted the Hemovac drain and closed the wound layers in usual fashion.           ______________________________ Georges Lynch Darrelyn Hillock, M.D.     RAG/MEDQ  D:  11/23/2012  T:  11/23/2012  Job:  [id number]

## 2012-11-23 NOTE — Anesthesia Postprocedure Evaluation (Signed)
  Anesthesia Post-op Note  Patient: Margaret Zhang  Procedure(s) Performed: Procedure(s) (LRB): TOTAL KNEE ARTHROPLASTY (Left)  Patient Location: PACU  Anesthesia Type: General  Level of Consciousness: awake and alert   Airway and Oxygen Therapy: Patient Spontanous Breathing  Post-op Pain: mild  Post-op Assessment: Post-op Vital signs reviewed, Patient's Cardiovascular Status Stable, Respiratory Function Stable, Patent Airway and No signs of Nausea or vomiting  Last Vitals:  Filed Vitals:   11/23/12 1000  BP: 94/51  Pulse: 64  Temp:   Resp: 9    Post-op Vital Signs: stable   Complications: No apparent anesthesia complications

## 2012-11-23 NOTE — Transfer of Care (Signed)
Immediate Anesthesia Transfer of Care Note  Patient: Margaret Zhang  Procedure(s) Performed: Procedure(s) (LRB) with comments: TOTAL KNEE ARTHROPLASTY (Left)  Patient Location: PACU  Anesthesia Type:General  Level of Consciousness: sedated  Airway & Oxygen Therapy: Patient Spontanous Breathing and Patient connected to face mask oxygen  Post-op Assessment: Report given to PACU RN and Post -op Vital signs reviewed and stable  Post vital signs: Reviewed and stable  Complications: No apparent anesthesia complications

## 2012-11-23 NOTE — Progress Notes (Signed)
Portable AP and Lateral Left Knee  X-RAYS done. 

## 2012-11-23 NOTE — Progress Notes (Signed)
X-ray results noted 

## 2012-11-23 NOTE — Interval H&P Note (Signed)
History and Physical Interval Note:  11/23/2012 7:37 AM  Margaret Zhang  has presented today for surgery, with the diagnosis of osteoarthritis left knee  The various methods of treatment have been discussed with the patient and family. After consideration of risks, benefits and other options for treatment, the patient has consented to  Procedure(s) (LRB) with comments: TOTAL KNEE ARTHROPLASTY (Left) as a surgical intervention .  The patient's history has been reviewed, patient examined, no change in status, stable for surgery.  I have reviewed the patient's chart and labs.  Questions were answered to the patient's satisfaction.     Giovanie Lefebre A

## 2012-11-23 NOTE — Progress Notes (Signed)
UR COMPLETED  

## 2012-11-23 NOTE — Evaluation (Signed)
Physical Therapy Evaluation Patient Details Name: Margaret Zhang MRN: [id number] DOB: 06-21-1946 Today's Date: 11/23/2012 Time: 4098-1191 PT Time Calculation (min): 23 min  PT Assessment / Plan / Recommendation Clinical Impression  66 y.o. female POD #0 for L TKA. Mod assist for supine to sit, sit to stand. Pt ambulated 5' from bed to chair with RW and min A. Excellent progress expected. HHPT recommended, no DME needed. Pt would benefit from acute PT to address decreased strength/ROM L knee, education in use of DME, and stair training.     PT Assessment  Patient needs continued PT services    Follow Up Recommendations  Home health PT    Does the patient have the potential to tolerate intense rehabilitation      Barriers to Discharge        Equipment Recommendations  None recommended by PT    Recommendations for Other Services OT consult   Frequency 7X/week    Precautions / Restrictions Precautions Precautions: Knee Required Braces or Orthoses: Knee Immobilizer - Left   Pertinent Vitals/Pain *SaO2 88% on RA with activity HR 76, BP 118/73 sitting No c/o pain, pt premedicated**      Mobility  Bed Mobility Bed Mobility: Supine to Sit Supine to Sit: 3: Mod assist Details for Bed Mobility Assistance: assist to support LLE and pivot hips to EOB, pad used Transfers Transfers: Sit to Stand;Stand to Sit Sit to Stand: 4: Min assist;From bed Stand to Sit: 4: Min assist;To chair/3-in-1 Details for Transfer Assistance: VCs hand placement Ambulation/Gait Ambulation/Gait Assistance: 4: Min assist Ambulation Distance (Feet): 5 Feet Assistive device: Rolling walker Gait Pattern: Step-to pattern General Gait Details: VCs sequencing    Shoulder Instructions     Exercises Total Joint Exercises Ankle Circles/Pumps: AROM;Both;10 reps;Supine Quad Sets: AROM;Both;5 reps;Supine Heel Slides: AAROM;Left;5 reps;Supine   PT Diagnosis: Difficulty walking  PT Problem List: Decreased  strength;Decreased range of motion;Decreased activity tolerance;Decreased mobility;Decreased knowledge of use of DME;Pain PT Treatment Interventions: Functional mobility training;Therapeutic activities;Therapeutic exercise;Patient/family education;Gait training;Stair training;DME instruction   PT Goals Acute Rehab PT Goals PT Goal Formulation: With patient Time For Goal Achievement: 11/30/12 Potential to Achieve Goals: Good Pt will go Supine/Side to Sit: with modified independence PT Goal: Supine/Side to Sit - Progress: Goal set today Pt will go Sit to Stand: with modified independence;with upper extremity assist PT Goal: Sit to Stand - Progress: Goal set today Pt will Ambulate: >150 feet;with modified independence;with rolling walker PT Goal: Ambulate - Progress: Goal set today Pt will Go Up / Down Stairs: 1-2 stairs;with min assist;with rail(s) PT Goal: Up/Down Stairs - Progress: Goal set today Pt will Perform Home Exercise Program: Independently PT Goal: Perform Home Exercise Program - Progress: Goal set today  Visit Information  Last PT Received On: 11/23/12 Assistance Needed: +1    Subjective Data  Subjective: I'll sit up.   Patient Stated Goal: return to prior level of independence   Prior Functioning  Home Living Available Help at Discharge: Available 24 hours/day;Family Type of Home: House Home Access: Stairs to enter [employer] of Steps: 1 Entrance Stairs-Rails: Right Home Layout: One level Bathroom Shower/Tub: Naval architect Equipment: Bedside commode/3-in-1;Shower chair without back;Walker - four wheeled;Straight cane Prior Function Level of Independence: Independent Able to Take Stairs?: Yes Communication Communication: No difficulties    Cognition  Overall Cognitive Status: Appears within functional limits for tasks assessed/performed Arousal/Alertness: Awake/alert Orientation Level: Appears intact for tasks assessed Behavior  During Session: [facility] for tasks performed  Extremity/Trunk Assessment Right Upper Extremity Assessment RUE ROM/Strength/Tone: Within functional levels Left Upper Extremity Assessment LUE ROM/Strength/Tone: Within functional levels Right Lower Extremity Assessment RLE ROM/Strength/Tone: Within functional levels RLE Sensation: WFL - Light Touch RLE Coordination: WFL - gross/fine motor Left Lower Extremity Assessment LLE ROM/Strength/Tone: Deficits;Due to pain LLE ROM/Strength/Tone Deficits: knee flexion AAROM approx. 30*; ankle WFL, hip 2/5 LLE Sensation: WFL - Light Touch LLE Coordination: WFL - gross/fine motor Trunk Assessment Trunk Assessment: Normal   Balance Balance Balance Assessed: Yes Static Sitting Balance Static Sitting - Balance Support: Bilateral upper extremity supported;Feet supported Static Sitting - Level of Assistance: 5: Stand by assistance Static Sitting - Comment/# of Minutes: 3- SBA 2* dizziness  End of Session PT - End of Session Equipment Utilized During Treatment: Gait belt Activity Tolerance: Patient tolerated treatment well Patient left: in chair;with call bell/phone within reach;with family/visitor present Nurse Communication: Mobility status  GP     Ralene Bathe Kistler 11/23/2012, 1:55 PM  [phone]

## 2012-11-24 LAB — GLUCOSE, CAPILLARY
Glucose-Capillary: 156 mg/dL — ABNORMAL HIGH (ref 70–99)
Glucose-Capillary: 148 mg/dL — ABNORMAL HIGH (ref 70–99)
Glucose-Capillary: 139 mg/dL — ABNORMAL HIGH (ref 70–99)

## 2012-11-24 LAB — CBC
HCT: 29.7 % — ABNORMAL LOW (ref 36.0–46.0)
Hemoglobin: 9.7 g/dL — ABNORMAL LOW (ref 12.0–15.0)
MCV: 84.4 fL (ref 78.0–100.0)
Platelets: 250 10*3/uL (ref 150–400)
RBC: 3.52 MIL/uL — ABNORMAL LOW (ref 3.87–5.11)
RDW: 15.3 % (ref 11.5–15.5)
WBC: 7.7 10*3/uL (ref 4.0–10.5)

## 2012-11-24 LAB — BASIC METABOLIC PANEL
Calcium: 8.6 mg/dL (ref 8.4–10.5)
GFR calc non Af Amer: >90 mL/min (ref >90)
Sodium: 132 mEq/L — ABNORMAL LOW (ref 135–145)

## 2012-11-24 MED ORDER — HYDROMORPHONE HCL 2 MG PO TABS
2.0000 mg | ORAL_TABLET | ORAL | Status: DC | PRN
  Administered 2012-11-25 (×6): 2 mg via ORAL
  Administered 2012-11-26: 4 mg via ORAL
  Administered 2012-11-26: 2 mg via ORAL
  Administered 2012-11-26 (×3): 4 mg via ORAL
  Administered 2012-11-27: 2 mg via ORAL
  Administered 2012-11-27: 4 mg via ORAL
  Administered 2012-11-27: 2 mg via ORAL
  Administered 2012-11-27 – 2012-11-28 (×7): 4 mg via ORAL
  Filled 2012-11-24 (×2): qty 2
  Filled 2012-11-24: qty 1
  Filled 2012-11-24: qty 2
  Filled 2012-11-24: qty 1
  Filled 2012-11-24: qty 2
  Filled 2012-11-24: qty 1
  Filled 2012-11-24 (×2): qty 2
  Filled 2012-11-24 (×2): qty 1
  Filled 2012-11-24: qty 2
  Filled 2012-11-24: qty 1
  Filled 2012-11-24 (×2): qty 2
  Filled 2012-11-24 (×2): qty 1
  Filled 2012-11-24: qty 2
  Filled 2012-11-24 (×2): qty 1
  Filled 2012-11-24: qty 2
  Filled 2012-11-24: qty 1

## 2012-11-24 MED ORDER — OXYCODONE-ACETAMINOPHEN 5-325 MG PO TABS: 2.0000 | ORAL_TABLET | ORAL | Status: DC | PRN

## 2012-11-24 MED ORDER — RIVAROXABAN 10 MG PO TABS: 10.0000 mg | ORAL_TABLET | Freq: Every day | ORAL | Status: DC

## 2012-11-24 MED ORDER — METHOCARBAMOL 500 MG PO TABS: 500.0000 mg | ORAL_TABLET | Freq: Four times a day (QID) | ORAL | Status: DC | PRN

## 2012-11-24 MED ORDER — DIPHENHYDRAMINE HCL 25 MG PO CAPS
25.0000 mg | ORAL_CAPSULE | Freq: Four times a day (QID) | ORAL | Status: DC | PRN
  Administered 2012-11-24: 25 mg via ORAL
  Filled 2012-11-24: qty 1

## 2012-11-24 NOTE — Progress Notes (Signed)
Physical Therapy Treatment Patient Details Name: Margaret Zhang MRN: [id number] DOB: [birth year] Today's Date: 11/24/2012 Time: 4098-1191 PT Time Calculation (min): 25 min  PT Assessment / Plan / Recommendation Comments on Treatment Session  Pt progressing with mobility, gait limited by dizziness. Decreased assist required for bed mobility and transfers.     Follow Up Recommendations  Home health PT     Does the patient have the potential to tolerate intense rehabilitation     Barriers to Discharge        Equipment Recommendations  None recommended by PT    Recommendations for Other Services OT consult  Frequency 7X/week   Plan Discharge plan remains appropriate;Frequency remains appropriate    Precautions / Restrictions Precautions Precautions: Knee Required Braces or Orthoses: Knee Immobilizer - Left   Pertinent Vitals/Pain *6/10 L knee with walking, 2-3/10 at rest Ice applied, premedicated**    Mobility  Bed Mobility Bed Mobility: Supine to Sit Supine to Sit: 4: Min assist;HOB elevated;With rails Details for Bed Mobility Assistance: assist to support LLE and pivot hips to EOB, pad used Transfers Transfers: Sit to Stand;Stand to Sit Sit to Stand: 4: Min assist;From bed Stand to Sit: 4: Min assist;To chair/3-in-1 Details for Transfer Assistance: VCs hand placement Ambulation/Gait Ambulation/Gait Assistance: 4: Min assist Ambulation Distance (Feet): 8 Feet Assistive device: Rolling walker Gait Pattern: Step-to pattern;Decreased step length - left General Gait Details: VCs sequencing, distance limited by dizziness    Exercises Total Joint Exercises Ankle Circles/Pumps: AROM;Both;10 reps;Supine Quad Sets: AROM;Both;Supine;10 reps Towel Squeeze: AROM;20 reps;Supine;Both Short Arc Quad: Left;10 reps;Supine;AAROM Heel Slides: AAROM;Left;Supine;10 reps Hip ABduction/ADduction: AAROM;Left;10 reps;Supine Straight Leg Raises: AAROM;Left;10 reps;Supine   PT Diagnosis:      PT Problem List:   PT Treatment Interventions:     PT Goals Acute Rehab PT Goals PT Goal Formulation: With patient Time For Goal Achievement: 11/30/12 Potential to Achieve Goals: Good Pt will go Supine/Side to Sit: with modified independence PT Goal: Supine/Side to Sit - Progress: Progressing toward goal Pt will go Sit to Stand: with modified independence;with upper extremity assist PT Goal: Sit to Stand - Progress: Progressing toward goal Pt will Ambulate: >150 feet;with modified independence;with rolling walker PT Goal: Ambulate - Progress: Progressing toward goal Pt will Go Up / Down Stairs: 1-2 stairs;with min assist;with rail(s) Pt will Perform Home Exercise Program: Independently PT Goal: Perform Home Exercise Program - Progress: Progressing toward goal  Visit Information  Last PT Received On: 11/24/12 Assistance Needed: +1    Subjective Data  Subjective: I'm a bit dizzy. (in standing) Patient Stated Goal: enjoy retirement   Cognition  Overall Cognitive Status: Appears within functional limits for tasks assessed/performed Arousal/Alertness: Awake/alert Orientation Level: Appears intact for tasks assessed Behavior During Session: [facility] for tasks performed    Balance     End of Session PT - End of Session Equipment Utilized During Treatment: Gait belt Activity Tolerance: Patient tolerated treatment well Patient left: in chair;with call bell/phone within reach;with family/visitor present Nurse Communication: Mobility status   GP     Ralene Bathe Kistler 11/24/2012, 9:32 AM [phone]

## 2012-11-24 NOTE — Progress Notes (Signed)
Physical Therapy Treatment Patient Details Name: ANAHIT KLUMB MRN: [id number] DOB: [birth year] Today's Date: 11/24/2012 Time: 4098-1191 PT Time Calculation (min): 10 min  PT Assessment / Plan / Recommendation Comments on Treatment Session  Pt tolerated ambulation better, no dizziness. She walked 71' with RW and min/guard assist.     Follow Up Recommendations  Home health PT     Does the patient have the potential to tolerate intense rehabilitation     Barriers to Discharge        Equipment Recommendations  None recommended by PT    Recommendations for Other Services OT consult  Frequency 7X/week   Plan Discharge plan remains appropriate;Frequency remains appropriate    Precautions / Restrictions Precautions Precautions: Knee Required Braces or Orthoses: Knee Immobilizer - Left   Pertinent Vitals/Pain *3/10 L knee with walking Ice applied, premedicated**    Mobility  Bed Mobility Bed Mobility: Supine to Sit Supine to Sit: 4: Min assist;HOB elevated;With rails Details for Bed Mobility Assistance: assist to support LLE and pivot hips to EOB, pad used Transfers Transfers: Sit to Stand;Stand to Sit Sit to Stand: 5: Supervision;From chair/3-in-1 Stand to Sit: To chair/3-in-1;5: Supervision;With upper extremity assist;With armrests Details for Transfer Assistance: VCs hand placement Ambulation/Gait Ambulation/Gait Assistance: 4: Min guard Ambulation Distance (Feet): 28 Feet Assistive device: Rolling walker Gait Pattern: Step-to pattern;Decreased step length - left General Gait Details: VCs sequencing    PT Diagnosis:    PT Problem List:   PT Treatment Interventions:     PT Goals Acute Rehab PT Goals PT Goal Formulation: With patient Time For Goal Achievement: 11/30/12 Potential to Achieve Goals: Good Pt will go Supine/Side to Sit: with modified independence PT Goal: Supine/Side to Sit - Progress: Progressing toward goal Pt will go Sit to Stand: with modified  independence;with upper extremity assist PT Goal: Sit to Stand - Progress: Progressing toward goal Pt will Ambulate: >150 feet;with modified independence;with rolling walker PT Goal: Ambulate - Progress: Progressing toward goal Pt will Go Up / Down Stairs: 1-2 stairs;with min assist;with rail(s) Pt will Perform Home Exercise Program: Independently PT Goal: Perform Home Exercise Program - Progress: Progressing toward goal  Visit Information  Last PT Received On: 11/24/12 Assistance Needed: +1    Subjective Data  Subjective: The pain is less. Patient Stated Goal: enjoy retirement   Cognition  Overall Cognitive Status: Appears within functional limits for tasks assessed/performed Arousal/Alertness: Awake/alert Orientation Level: Appears intact for tasks assessed Behavior During Session: [facility] for tasks performed    Balance     End of Session PT - End of Session Equipment Utilized During Treatment: Gait belt Activity Tolerance: Patient tolerated treatment well Patient left: in chair;with call bell/phone within reach;with family/visitor present Nurse Communication: Mobility status   GP     Ralene Bathe Kistler 11/24/2012, 9:52 AM [phone]

## 2012-11-24 NOTE — Progress Notes (Signed)
Choices offered   HOME HEALTH AGENCIES SERVING [place]   Agencies that are Medicare-Certified and are affiliated with [facility]  Telephone Number Address  [facility]   [facility] has ownership interest in this company; however, you are under no obligation to use this agency. [phone] or  [phone] [address] [place], [place] [address] [email or web address]   Agencies that are Medicare-Certified and are not affiliated with [facility]                                                                                 Home Health Agency Telephone Number Address  [facility] [phone] Fax [phone] [address], [address] [place], [place]  [address] [email or web address]  [facility] [phone] or [phone] Fax [phone] [address] [place], [place] [address] [email or web address]  [facility] [phone] Fax [phone] [address] [place], [place] [address] [email or web address]  [facility] [phone] Fax [phone] [address], [address] [place], [place]  [address] [email or web address]  [facility] [phone] Fax [phone] [address] [place], [place] [address] [email or web address]  [facility] [phone] [address] [place], [place] [address] [email or web address]  [facility] [phone]  [address] , [place] [address] [email or web address]  [facility] [phone] or [phone] Fax number [phone] [address], [address] , [place]  [address] [email or web address]  [facility] [phone] Fax [phone] [address] [place],    [address]  [facility]  [phone] Fax [phone] [address] [place],  [address] [email or web address]   

## 2012-11-24 NOTE — Progress Notes (Signed)
Subjective: 1 Day Post-Op Procedure(s) (LRB): TOTAL KNEE ARTHROPLASTY (Left) Patient reports pain as 4 on 0-10 scale. Hemovac Dcd. and dressing changed and wound looks fine. Up ambulating.   Objective: Vital signs in last 24 hours: Temp:  [97.4 F (36.3 C)-98.2 F (36.8 C)] 98 F (36.7 C) (11/28 0505) Pulse Rate:  [62-89] 76  (11/28 0505) Resp:  [9-20] 16  (11/28 0505) BP: (90-118)/(46-73) 99/63 mmHg (11/28 0505) SpO2:  [88 %-100 %] 91 % (11/28 0505) Weight:  [92.9 kg (204 lb 12.9 oz)] 92.9 kg (204 lb 12.9 oz) (11/27 1254)  Intake/Output from previous day: 11/27 0701 - 11/28 0700 In: 3793.3 [P.O.:240; I.V.:3553.3] Out: 1495 [Urine:1200; Drains:295] Intake/Output this shift:     Basename 11/24/12 0503  HGB 9.7*    Basename 11/24/12 0503  WBC 7.7  RBC 3.52*  HCT 29.7*  PLT 250    Basename 11/24/12 0503  NA 132*  K 4.2  CL 97  CO2 24  BUN 10  CREATININE 0.58  GLUCOSE 137*  CALCIUM 8.6   No results found for this basename: LABPT:2,INR:2 in the last 72 hours  Dorsiflexion/Plantar flexion intact  Assessment/Plan: 1 Day Post-Op Procedure(s) (LRB): TOTAL KNEE ARTHROPLASTY (Left) Up with therapy DC plans for Friday or Saturday.  Margaret Zhang A 11/24/2012, 8:09 AM

## 2012-11-24 NOTE — Care Management Note (Addendum)
    Page 1 of 2   11/25/2012     10:49:17 AM   CARE MANAGEMENT NOTE 11/25/2012  Patient:  Margaret Zhang, Margaret Zhang   Account Number:  [id number]  Date Initiated:  11/24/2012  Documentation initiated by:  Dennis Bast Assessment:   66 yr old female medicare dx LTKA has knee immobilizer and rolling walker at home. [facility] and [facility] [address]  (by Raynaldo Opitz for DME     Action/Plan:   Spoke with pt reviewed EPIC notes   Anticipated DC Date:  11/26/2012   Anticipated DC Plan:  HOME W HOME HEALTH SERVICES      DC Planning Services  CM consult      [facility] Choice  HOME HEALTH   Choice offered to / List presented to:  C-1 Patient        HH arranged  HH-2 PT      [facility] agency  [facility]   Status of service:  Completed, signed off Medicare Important Message given?   (If response is "NO", the following Medicare IM given date fields will be blank) Date Medicare IM given:   Date Additional Medicare IM given:    Discharge Disposition:  HOME W HOME HEALTH SERVICES  Per UR Regulation:  Reviewed for med. necessity/level of care/duration of stay  If discussed at Long Length of Stay Meetings, dates discussed:    Comments:  11/24/12 1138 Clinton Gallant, RN, CCM [phone] Pt states Dr Darrelyn Hillock informed her she would be d/c home on Saturday 11/26/12 No DME recommended by PT. Spoke with OT staff who will see pt on 11/25/12 and make recommendations prn RN & Pt updated 1425 faxed [facility] for HH[facility] with fax confirmation receieved

## 2012-11-24 NOTE — Progress Notes (Deleted)
Physical Therapy Treatment Patient Details Name: Margaret Zhang MRN: [id number] DOB: 01-09-1946 Today's Date: 11/24/2012 Time: 4098-1191 PT Time Calculation (min): 25 min  PT Assessment / Plan / Recommendation Comments on Treatment Session       Follow Up Recommendations        Does the patient have the potential to tolerate intense rehabilitation     Barriers to Discharge        Equipment Recommendations       Recommendations for Other Services    Frequency     Plan      Precautions / Restrictions Precautions Precautions: Knee Required Braces or Orthoses: Knee Immobilizer - Left   Pertinent Vitals/Pain **6/10 L knee with walking, 2-3/10 at rest Premedicated, ice applied*    Mobility  Bed Mobility Bed Mobility: Supine to Sit Supine to Sit: 4: Min assist;HOB elevated;With rails Details for Bed Mobility Assistance: assist to support LLE and pivot hips to EOB, pad used Transfers Transfers: Sit to Stand;Stand to Sit Sit to Stand: 4: Min assist;From bed Stand to Sit: 4: Min assist;To chair/3-in-1 Details for Transfer Assistance: VCs hand placement Ambulation/Gait Ambulation/Gait Assistance: 4: Min assist Ambulation Distance (Feet): 8 Feet Assistive device: Rolling walker Gait Pattern: Step-to pattern;Decreased step length - left General Gait Details: VCs sequencing, distance limited by dizziness    Exercises Total Joint Exercises Ankle Circles/Pumps: AROM;Both;10 reps;Supine Quad Sets: AROM;Both;Supine;10 reps Towel Squeeze: AROM;20 reps;Supine;Both Short Arc Quad: Left;10 reps;Supine;AAROM Heel Slides: AAROM;Left;Supine;10 reps Hip ABduction/ADduction: AAROM;Left;10 reps;Supine Straight Leg Raises: AAROM;Left;10 reps;Supine   PT Diagnosis:    PT Problem List:   PT Treatment Interventions:     PT Goals Acute Rehab PT Goals PT Goal Formulation: With patient Time For Goal Achievement: 11/30/12 Potential to Achieve Goals: Good Pt will go Supine/Side to Sit:  with modified independence PT Goal: Supine/Side to Sit - Progress: Progressing toward goal Pt will go Sit to Stand: with modified independence;with upper extremity assist PT Goal: Sit to Stand - Progress: Progressing toward goal Pt will Ambulate: >150 feet;with modified independence;with rolling walker PT Goal: Ambulate - Progress: Progressing toward goal Pt will Go Up / Down Stairs: 1-2 stairs;with min assist;with rail(s) Pt will Perform Home Exercise Program: Independently PT Goal: Perform Home Exercise Program - Progress: Progressing toward goal  Visit Information  Last PT Received On: 11/24/12 Assistance Needed: +1    Subjective Data  Subjective: I'm a bit dizzy. (in standing) Patient Stated Goal: enjoy retirement   Cognition  Overall Cognitive Status: Appears within functional limits for tasks assessed/performed Arousal/Alertness: Awake/alert Orientation Level: Appears intact for tasks assessed Behavior During Session: [facility] for tasks performed    Balance     End of Session     GP     Tamala Ser 11/24/2012, 9:30 AM [phone]

## 2012-11-24 NOTE — Progress Notes (Signed)
At 1425 faxed [facility] for HH[facility] with fax confirmation receieved

## 2012-11-25 DIAGNOSIS — E871 Hypo-osmolality and hyponatremia: Secondary | ICD-10-CM

## 2012-11-25 DIAGNOSIS — E876 Hypokalemia: Secondary | ICD-10-CM

## 2012-11-25 DIAGNOSIS — D62 Acute posthemorrhagic anemia: Secondary | ICD-10-CM

## 2012-11-25 LAB — BASIC METABOLIC PANEL
Calcium: 8.7 mg/dL (ref 8.4–10.5)
Creatinine, Ser: 0.7 mg/dL (ref 0.50–1.10)
GFR calc Af Amer: >90 mL/min (ref >90)
GFR calc non Af Amer: 88 mL/min — ABNORMAL LOW (ref >90)
Sodium: 130 mEq/L — ABNORMAL LOW (ref 135–145)

## 2012-11-25 LAB — CBC
MCH: 27.2 pg (ref 26.0–34.0)
MCV: 84.1 fL (ref 78.0–100.0)
Platelets: 235 10*3/uL (ref 150–400)
RDW: 15.3 % (ref 11.5–15.5)
WBC: 9.2 10*3/uL (ref 4.0–10.5)

## 2012-11-25 MED ORDER — POTASSIUM CHLORIDE CRYS ER 20 MEQ PO TBCR
40.0000 meq | EXTENDED_RELEASE_TABLET | Freq: Two times a day (BID) | ORAL | Status: AC
  Administered 2012-11-25 (×2): 40 meq via ORAL
  Filled 2012-11-25 (×2): qty 2

## 2012-11-25 NOTE — Evaluation (Addendum)
Occupational Therapy Evaluation Patient Details Name: Margaret Zhang MRN: [id number] DOB: 04-Apr-1946 Today's Date: 11/25/2012 Time: 1191-4782 OT Time Calculation (min): 22 min  OT Assessment / Plan / Recommendation Clinical Impression  Pt is s/p L TKA and displays increased pain and overall decreased functional mobility and ADL. Will benefit from skilled OT services to improve ADL independence to discharge home with family and neighbor assisting.    OT Assessment  Patient needs continued OT Services    Follow Up Recommendations  Home health OT    Barriers to Discharge      Equipment Recommendations  3 in 1 bedside comode; will also need a Rolling Walker. pt states she doesn't have a RW (only a 4 wheeled)     Recommendations for Other Services    Frequency  Min 2X/week    Precautions / Restrictions Precautions Precautions: Knee Required Braces or Orthoses: Knee Immobilizer - Left Knee Immobilizer - Left: Discontinue once straight leg raise with < 10 degree lag Restrictions Weight Bearing Restrictions: No Other Position/Activity Restrictions: WBAT        ADL  Eating/Feeding: Simulated;Independent Where Assessed - Eating/Feeding: Chair Grooming: Simulated;Wash/dry hands;Set up Where Assessed - Grooming: Supported sitting Upper Body Bathing: Simulated;Chest;Right arm;Left arm;Abdomen;Set up Where Assessed - Upper Body Bathing: Unsupported sitting Lower Body Bathing: Simulated;Moderate assistance Where Assessed - Lower Body Bathing: Supported sit to stand Upper Body Dressing: Simulated;Set up Where Assessed - Upper Body Dressing: Unsupported sitting Lower Body Dressing: Moderate assistance Where Assessed - Lower Body Dressing: Supported sit to stand Toilet Transfer: Performed;Minimal Web designer: Bedside commode;Other (comment) (across the room) Toileting - Clothing Manipulation and Hygiene: Simulated;Minimal assistance Where Assessed - Toileting  Clothing Manipulation and Hygiene: Sit to stand from 3-in-1 or toilet Tub/Shower Transfer Method: Not assessed Equipment Used: Rolling walker ADL Comments: Pt states sister in law and neighbor can help at discharge because her 29 year old mother is the only other family that is planning to help some and she doesnt feel she can do everything. Educated on 3in1 use as shower chair. Pt having dificulty reaching down to either foot today due to pain but states sister in law can help with LB ADL.    OT Diagnosis: Generalized weakness;Acute pain  OT Problem List: Decreased strength;Decreased knowledge of use of DME or AE;Pain OT Treatment Interventions: Self-care/ADL training;Therapeutic activities;DME and/or AE instruction;Patient/family education   OT Goals Acute Rehab OT Goals OT Goal Formulation: With patient Time For Goal Achievement: 12/02/12 Potential to Achieve Goals: Good ADL Goals Pt Will Perform Grooming: with supervision;Standing at sink ADL Goal: Grooming - Progress: Goal set today Pt Will Transfer to Toilet: with supervision;Ambulation;with DME;3-in-1 ADL Goal: Toilet Transfer - Progress: Goal set today Pt Will Perform Toileting - Clothing Manipulation: with supervision;Standing ADL Goal: Toileting - Clothing Manipulation - Progress: Goal set today Pt Will Perform Tub/Shower Transfer: with min assist;with DME;Other (comment);Shower transfer (min guard assist) ADL Goal: Tub/Shower Transfer - Progress: Goal set today  Visit Information  Last OT Received On: 11/25/12 Assistance Needed: +1    Subjective Data  Subjective: I got up to the commode earlier this morning Patient Stated Goal: wants to return home and ensure enough help   Prior Functioning     Home Living Available Help at Discharge: Available 24 hours/day;Family;Other (Comment) (states sister in law and neighbor can stay) Type of Home: House Home Access: Stairs to enter [employer] of Steps: 1 Entrance  Stairs-Rails: Right Home Layout: One level Bathroom Shower/Tub: Walk-in shower  Bathroom Toilet: Standard Home Adaptive Equipment: Environmental consultant - four wheeled;Straight cane Prior Function Level of Independence: Independent Able to Take Stairs?: Yes Communication Communication: No difficulties         Vision/Perception     Cognition  Overall Cognitive Status: Appears within functional limits for tasks assessed/performed Arousal/Alertness: Awake/alert Orientation Level: Appears intact for tasks assessed Behavior During Session: [facility] for tasks performed    Extremity/Trunk Assessment Right Upper Extremity Assessment RUE ROM/Strength/Tone: [facility] for tasks assessed Left Upper Extremity Assessment LUE ROM/Strength/Tone: WFL for tasks assessed     Mobility Transfers Transfers: Sit to Stand;Stand to Sit Sit to Stand: 4: Min assist;With upper extremity assist;From chair/3-in-1 Stand to Sit: 4: Min assist;With upper extremity assist;To chair/3-in-1 Details for Transfer Assistance: min verbal cues for hand placement and LE management     Shoulder Instructions     Exercise Total Joint Exercises Ankle Circles/Pumps: AROM;Both;Supine;20 reps Quad Sets: AROM;Both;Supine;20 reps Heel Slides: AAROM;Left;Supine;20 reps Straight Leg Raises: AAROM;Left;Supine;20 reps   Balance Balance Balance Assessed: Yes Static Sitting Balance Static Sitting - Level of Assistance: 4: Min assist   End of Session OT - End of Session Activity Tolerance: Patient tolerated treatment well Patient left: in chair;with call bell/phone within reach  GO     Lennox Laity [phone] 11/25/2012, 12:49 PM

## 2012-11-25 NOTE — Progress Notes (Signed)
Physical Therapy Treatment Patient Details Name: PENNY MANOLIS MRN: [id number] DOB: 12/07/1946 Today's Date: 11/25/2012 Time: 3086-5784 PT Time Calculation (min): 15 min  PT Assessment / Plan / Recommendation Comments on Treatment Session  Pt requests break between ther ex and ambulation    Follow Up Recommendations  Home health PT     Does the patient have the potential to tolerate intense rehabilitation     Barriers to Discharge        Equipment Recommendations  3 in 1 bedside comode    Recommendations for Other Services OT consult  Frequency 7X/week   Plan Discharge plan remains appropriate;Frequency remains appropriate    Precautions / Restrictions Precautions Precautions: Knee Required Braces or Orthoses: Knee Immobilizer - Left Knee Immobilizer - Left: Discontinue once straight leg raise with < 10 degree lag Restrictions Weight Bearing Restrictions: No Other Position/Activity Restrictions: WBAT   Pertinent Vitals/Pain     Mobility  Transfers Transfers: Sit to Stand;Stand to Sit Sit to Stand: 4: Min assist;With upper extremity assist;From chair/3-in-1 Stand to Sit: 4: Min assist;With upper extremity assist;To chair/3-in-1 Details for Transfer Assistance: min verbal cues for hand placement and LE management Ambulation/Gait Ambulation/Gait Assistance: 4: Min guard;4: Min Environmental consultant (Feet): 39 Feet Assistive device: Rolling walker Ambulation/Gait Assistance Details: cues for posture, stride length and position from RW Gait Pattern: Step-to pattern;Decreased step length - left General Gait Details: VCs sequencing    Exercises Total Joint Exercises Ankle Circles/Pumps: AROM;Both;Supine;20 reps Quad Sets: AROM;Both;Supine;20 reps Heel Slides: AAROM;Left;Supine;20 reps Straight Leg Raises: AAROM;Left;Supine;20 reps   PT Diagnosis:    PT Problem List:   PT Treatment Interventions:     PT Goals Acute Rehab PT Goals PT Goal Formulation: With  patient Time For Goal Achievement: 11/30/12 Potential to Achieve Goals: Good Pt will go Supine/Side to Sit: with modified independence PT Goal: Supine/Side to Sit - Progress: Progressing toward goal Pt will go Sit to Stand: with modified independence;with upper extremity assist PT Goal: Sit to Stand - Progress: Progressing toward goal Pt will Ambulate: >150 feet;with modified independence;with rolling walker PT Goal: Ambulate - Progress: Progressing toward goal Pt will Go Up / Down Stairs: 1-2 stairs;with min assist;with rail(s) Pt will Perform Home Exercise Program: Independently PT Goal: Perform Home Exercise Program - Progress: Progressing toward goal  Visit Information  Last PT Received On: 11/25/12 Assistance Needed: +1    Subjective Data  Subjective: I feel kind of weak Patient Stated Goal: enjoy retirement   Cognition  Overall Cognitive Status: Appears within functional limits for tasks assessed/performed Arousal/Alertness: Awake/alert Orientation Level: Appears intact for tasks assessed Behavior During Session: [facility] for tasks performed    Balance  Balance Balance Assessed: Yes Static Sitting Balance Static Sitting - Level of Assistance: 4: Min assist  End of Session PT - End of Session Equipment Utilized During Treatment: Gait belt;Left knee immobilizer Activity Tolerance: Patient tolerated treatment well;Patient limited by pain Patient left: in chair;with call bell/phone within reach;with family/visitor present Nurse Communication: Mobility status   GP     Cissy Galbreath 11/25/2012, 12:35 PM

## 2012-11-25 NOTE — Progress Notes (Signed)
Physical Therapy Treatment Patient Details Name: KAMILLAH DEVIVO MRN: [id number] DOB: [birth year] Today's Date: 11/25/2012 Time: 2841-3244 PT Time Calculation (min): 25 min  PT Assessment / Plan / Recommendation Comments on Treatment Session  Pt requests break between ther ex and ambulation    Follow Up Recommendations  Home health PT     Does the patient have the potential to tolerate intense rehabilitation     Barriers to Discharge        Equipment Recommendations  3 in 1 bedside comode    Recommendations for Other Services OT consult  Frequency 7X/week   Plan Discharge plan remains appropriate;Frequency remains appropriate    Precautions / Restrictions Precautions Precautions: Knee Required Braces or Orthoses: Knee Immobilizer - Left Knee Immobilizer - Left: Discontinue once straight leg raise with < 10 degree lag Restrictions Weight Bearing Restrictions: No Other Position/Activity Restrictions: WBAT   Pertinent Vitals/Pain 7-8/10 with there ex, premedicated, cold packs provided    Mobility  Transfers Sit to Stand: 4: Min assist;With upper extremity assist;From chair/3-in-1 Stand to Sit: 4: Min assist;With upper extremity assist;To chair/3-in-1 Details for Transfer Assistance: min verbal cues for hand placement and LE management    Exercises Total Joint Exercises Ankle Circles/Pumps: AROM;Both;Supine;20 reps Quad Sets: AROM;Both;Supine;20 reps Heel Slides: AAROM;Left;Supine;20 reps Straight Leg Raises: AAROM;Left;Supine;20 reps   PT Diagnosis:    PT Problem List:   PT Treatment Interventions:     PT Goals Acute Rehab PT Goals PT Goal Formulation: With patient Time For Goal Achievement: 11/30/12 Potential to Achieve Goals: Good Pt will Perform Home Exercise Program: Independently PT Goal: Perform Home Exercise Program - Progress: Progressing toward goal  Visit Information  Last PT Received On: 11/25/12 Assistance Needed: +1    Subjective Data  Subjective: It burns and pulls a lot Patient Stated Goal: enjoy retirement   Cognition  Overall Cognitive Status: Appears within functional limits for tasks assessed/performed Arousal/Alertness: Awake/alert Orientation Level: Appears intact for tasks assessed Behavior During Session: [facility] for tasks performed    Balance  Balance Balance Assessed: Yes Static Sitting Balance Static Sitting - Level of Assistance: 4: Min assist  End of Session PT - End of Session Activity Tolerance: Patient tolerated treatment well;Patient limited by pain Patient left: in chair;with call bell/phone within reach;with family/visitor present Nurse Communication: Mobility status   GP     Leanne Sisler 11/25/2012, 12:31 PM

## 2012-11-25 NOTE — Progress Notes (Signed)
   Subjective: 2 Days Post-Op Procedure(s) (LRB): TOTAL KNEE ARTHROPLASTY (Left) Patient reports pain as mild.   Patient seen in rounds gor Dr. Darrelyn Hillock. Patient is well, but has had some minor complaints of pain in the knee and burning in the knee, requiring pain medications Plan is to go Home after hospital stay. Probably tomorrow - Saturday.  Objective: Vital signs in last 24 hours: Temp:  [98.3 F (36.8 C)-99.5 F (37.5 C)] 99.5 F (37.5 C) (11/29 0422) Pulse Rate:  [63-79] 79  (11/29 0422) Resp:  [16-20] 20  (11/29 0422) BP: (94-107)/(58-65) 107/65 mmHg (11/29 0422) SpO2:  [85 %-97 %] 91 % (11/29 0422)  Intake/Output from previous day:  Intake/Output Summary (Last 24 hours) at 11/25/12 0812 Last data filed at 11/25/12 0725  Gross per 24 hour  Intake   1690 ml  Output    975 ml  Net    715 ml    Intake/Output this shift: Total I/O In: 240 [P.O.:240] Out: 150 [Urine:150]  Labs:  [facility] 11/25/12 0417 11/24/12 0503  HGB 9.1* 9.7*    Basename 11/25/12 0417 11/24/12 0503  WBC 9.2 7.7  RBC 3.34* 3.52*  HCT 28.1* 29.7*  PLT 235 250    Basename 11/25/12 0417 11/24/12 0503  NA 130* 132*  K 3.4* 4.2  CL 95* 97  CO2 26 24  BUN 12 10  CREATININE 0.70 0.58  GLUCOSE 182* 137*  CALCIUM 8.7 8.6   No results found for this basename: LABPT:2,INR:2 in the last 72 hours  EXAM General - Patient is Alert, Appropriate and Oriented Extremity - Neurovascular intact Sensation intact distally Dorsiflexion/Plantar flexion intact No cellulitis present Dressing/Incision - clean, dry, no drainage, healing Motor Function - intact, moving foot and toes well on exam.   Past Medical History  Diagnosis Date  . Hypertension   . Diabetes mellitus without complication   . GERD (gastroesophageal reflux disease)   . Arthritis     Assessment/Plan: 2 Days Post-Op Procedure(s) (LRB): TOTAL KNEE ARTHROPLASTY (Left) Active Problems:  Osteoarthritis of knee  Estimated Body  mass index is 35.16 kg/(m^2) as calculated from the following:   Height as of this encounter: 5\' 4" (1.626 m).   Weight as of this encounter: 204 lb 12.9 oz(92.9 kg). Up with therapy Plan for discharge tomorrow Discharge home with home health  DVT Prophylaxis - Xarelto Weight-Bearing as tolerated to left leg  PERKINS, ALEXZANDREW 11/25/2012, 8:12 AM

## 2012-11-25 NOTE — Progress Notes (Signed)
Physical Therapy Treatment Patient Details Name: Margaret Zhang MRN: [id number] DOB: 1946/03/18 Today's Date: 11/25/2012 Time: 4098-1191 PT Time Calculation (min): 20 min  PT Assessment / Plan / Recommendation Comments on Treatment Session       Follow Up Recommendations  Home health PT     Does the patient have the potential to tolerate intense rehabilitation     Barriers to Discharge        Equipment Recommendations  3 in 1 bedside comode;Rolling walker with 5" wheels    Recommendations for Other Services OT consult  Frequency 7X/week   Plan Discharge plan remains appropriate;Frequency remains appropriate    Precautions / Restrictions Precautions Precautions: Knee Required Braces or Orthoses: Knee Immobilizer - Left Knee Immobilizer - Left: Discontinue once straight leg raise with < 10 degree lag Restrictions Weight Bearing Restrictions: No Other Position/Activity Restrictions: WBAT   Pertinent Vitals/Pain     Mobility  Bed Mobility Bed Mobility: Sit to Supine Sit to Supine: 4: Min assist Details for Bed Mobility Assistance: cues for sequence and assist for L LE Transfers Transfers: Sit to Stand;Stand to Sit Sit to Stand: 4: Min assist;With upper extremity assist;From chair/3-in-1 Stand to Sit: 4: Min assist;With upper extremity assist;To bed Details for Transfer Assistance: min verbal cues for hand placement and LE management Ambulation/Gait Ambulation/Gait Assistance: 4: Min assist Ambulation Distance (Feet): 30 Feet Assistive device: Rolling walker Ambulation/Gait Assistance Details: cues for posture and position from RW Gait Pattern: Step-to pattern Stairs: Yes Stairs Assistance: 4: Min assist Stairs Assistance Details (indicate cue type and reason): cues for sequence and foot/RW placement Stair Management Technique: No rails;Step to pattern;With walker;Backwards Number of Stairs: 1     Exercises     PT Diagnosis:    PT Problem List:   PT Treatment  Interventions:     PT Goals Acute Rehab PT Goals PT Goal Formulation: With patient Time For Goal Achievement: 11/30/12 Potential to Achieve Goals: Good Pt will go Supine/Side to Sit: with modified independence PT Goal: Supine/Side to Sit - Progress: Progressing toward goal Pt will go Sit to Stand: with modified independence;with upper extremity assist PT Goal: Sit to Stand - Progress: Progressing toward goal Pt will Ambulate: >150 feet;with modified independence;with rolling walker PT Goal: Ambulate - Progress: Progressing toward goal Pt will Go Up / Down Stairs: 1-2 stairs;with min assist;with rail(s) PT Goal: Up/Down Stairs - Progress: Progressing toward goal Pt will Perform Home Exercise Program: Independently PT Goal: Perform Home Exercise Program - Progress: Progressing toward goal  Visit Information  Last PT Received On: 11/25/12 Assistance Needed: +1    Subjective Data  Subjective: I just feel so weak, I don't think I will be ready to go home tomorrow Patient Stated Goal: enjoy retirement   Cognition  Overall Cognitive Status: Appears within functional limits for tasks assessed/performed Arousal/Alertness: Awake/alert Orientation Level: Appears intact for tasks assessed Behavior During Session: [facility] for tasks performed    Balance     End of Session PT - End of Session Equipment Utilized During Treatment: Gait belt;Left knee immobilizer Activity Tolerance: Patient limited by fatigue Patient left: in bed;with call bell/phone within reach Nurse Communication: Mobility status   GP     Margaret Zhang 11/25/2012, 4:48 PM

## 2012-11-26 LAB — CBC
HCT: 29.6 % — ABNORMAL LOW (ref 36.0–46.0)
Hemoglobin: 9.7 g/dL — ABNORMAL LOW (ref 12.0–15.0)
MCH: 27.5 pg (ref 26.0–34.0)
MCHC: 32.8 g/dL (ref 30.0–36.0)
MCV: 83.9 fL (ref 78.0–100.0)

## 2012-11-26 LAB — BASIC METABOLIC PANEL
BUN: 10 mg/dL (ref 6–23)
Calcium: 8.8 mg/dL (ref 8.4–10.5)
Creatinine, Ser: 0.56 mg/dL (ref 0.50–1.10)
GFR calc non Af Amer: >90 mL/min (ref >90)
Glucose, Bld: 126 mg/dL — ABNORMAL HIGH (ref 70–99)
Potassium: 4.2 mEq/L (ref 3.5–5.1)

## 2012-11-26 LAB — GLUCOSE, CAPILLARY: Glucose-Capillary: 127 mg/dL — ABNORMAL HIGH (ref 70–99)

## 2012-11-26 NOTE — Progress Notes (Signed)
Physical Therapy Treatment Patient Details Name: Margaret Zhang MRN: [id number] DOB: 10-06-1946 Today's Date: 11/26/2012 Time: 8295-6213 PT Time Calculation (min): 34 min  PT Assessment / Plan / Recommendation Comments on Treatment Session  POD # 4 pm session L TKR.  Assisted to BR to void then to recliner to perform TKR TE's. Pt required increased time and rests breaks due to increased pain.  ICE applied.    Follow Up Recommendations  SNF (Pt initially planned on D/C to home but now requestes to D/C to SNF) First choice [facility]     Does the patient have the potential to tolerate intense rehabilitation     Barriers to Discharge        Equipment Recommendations  Rolling walker with 5" wheels;3 in 1 bedside comode    Recommendations for Other Services    Frequency 7X/week   Plan Discharge plan remains appropriate    Precautions / Restrictions Precautions Precautions: Knee Precaution Comments: Instructed opt on KI use for amb Required Braces or Orthoses: Knee Immobilizer - Left Knee Immobilizer - Left: Discontinue once straight leg raise with < 10 degree lag Restrictions Weight Bearing Restrictions: No Other Position/Activity Restrictions: WBAT    Pertinent Vitals/Pain C/o 8/10 during TE's ICE applied    Mobility  Bed Mobility Bed Mobility: Not assessed Details for Bed Mobility Assistance: Pt OOB in recliner Transfers Transfers: Sit to Stand;Stand to Sit Sit to Stand: 4: Min guard;4: Min assist;From chair/3-in-1;From toilet Stand to Sit: 4: Min guard;4: Min assist;To chair/3-in-1;To toilet Details for Transfer Assistance: 25% VC's on proper tech and hand placement plus to extend L LE prior to sit.  Ambulation/Gait Ambulation/Gait Assistance: 4: Min guard;4: Min Environmental consultant (Feet): 15 Feet Assistive device: Rolling walker Ambulation/Gait Assistance Details: Only amb to and from bathroom 2nd c/o fatigue and wanting to do her TKR TE's. Gait Pattern:  Step-to pattern;Decreased stance time - left Gait velocity: decreased    Exercises Total Joint Exercises Ankle Circles/Pumps: AROM;Both;10 reps;Supine Quad Sets: AROM;Both;10 reps;Supine Gluteal Sets: AROM;Both;10 reps;Supine Towel Squeeze: AROM;Both;10 reps;Supine Short Arc Quad: AAROM;Left;10 reps;Supine Heel Slides: AAROM;Left;10 reps;Supine Hip ABduction/ADduction: AAROM;Left;10 reps;Supine Straight Leg Raises: AAROM;Left;10 reps;Supine    PT Goals                                                    progressing    Visit Information  Last PT Received On: 11/26/12 Assistance Needed: +1    Subjective Data      Cognition       Balance     End of Session PT - End of Session Equipment Utilized During Treatment: Gait belt Activity Tolerance: Patient limited by fatigue;Patient limited by pain Patient left: in chair;with call bell/phone within reach (ICE to L knee)   Felecia Shelling  PTA [facility]  Acute  Rehab Pager     [phone]

## 2012-11-26 NOTE — Progress Notes (Signed)
Clinical Social Work Department CLINICAL SOCIAL WORK PLACEMENT NOTE 11/26/2012  Patient:  Margaret Zhang, Margaret Zhang  Account Number:  [id number] Admit date:  11/23/2012  Clinical Social Worker:  Doroteo Glassman  Date/time:  11/26/2012 10:37 AM  Clinical Social Work is seeking post-discharge placement for this patient at the following level of care:   SKILLED NURSING   (*CSW will update this form in Epic as items are completed)   Will give with bed offers  Patient/family provided with [facility] Department of Clinical Social Work's list of facilities offering this level of care within the geographic area requested by the patient (or if unable, by the patient's family).  Will discuss when bed offers are given Patient/family informed of their freedom to choose among providers that offer the needed level of care, that participate in Medicare, Medicaid or managed care program needed by the patient, have an available bed and are willing to accept the patient.  Will discuss when bed offers are given  Patient/family informed of [facility]' ownership interest in [facility], as well as of the fact that they are under no obligation to receive care at this facility.  PASARR submitted to EDS on 11/26/2012 PASARR number received from [employer] on 11/26/2012  FL2 transmitted to all facilities in geographic area requested by pt/family on  11/26/2012 FL2 transmitted to all facilities within larger geographic area on   Patient informed that his/her managed care company has contracts with or will negotiate with  certain facilities, including the following:     Patient/family informed of bed offers received:   Patient chooses bed at  Physician recommends and patient chooses bed at    Patient to be transferred to  on   Patient to be transferred to facility by   The following physician request were entered in Epic:   Additional Comments:  CSW to continue to follow.  Providence Crosby,  LCSWA Clinical Social Work [phone]

## 2012-11-26 NOTE — Progress Notes (Signed)
Physical Therapy Treatment Patient Details Name: Margaret Zhang MRN: [id number] DOB: March 13, 1946 Today's Date: 11/26/2012 Time: 3086-5784 PT Time Calculation (min): 26 min  PT Assessment / Plan / Recommendation Comments on Treatment Session  POD # 4 L TKR am session.  Amb pt in hallway, assisted to BR then positioned in recliner with ICE.  Pt requested pain meds prior to attempting TE's.    Follow Up Recommendations  SNF (Pt initially planned on D/C to home but now requestes to D/C to SNF First choice is [facility]     Does the patient have the potential to tolerate intense rehabilitation     Barriers to Discharge        Equipment Recommendations  Rolling walker with 5" wheels;3 in 1 bedside comode    Recommendations for Other Services    Frequency 7X/week   Plan Discharge plan remains appropriate    Precautions / Restrictions Precautions Precautions: Knee Precaution Comments: Instructed opt on KI use for amb Required Braces or Orthoses: Knee Immobilizer - Left Knee Immobilizer - Left: Discontinue once straight leg raise with < 10 degree lag Restrictions Weight Bearing Restrictions: No Other Position/Activity Restrictions: WBAT    Pertinent Vitals/Pain C/o 8/10 pain during amb ICE applied     Mobility  Bed Mobility Bed Mobility: Not assessed Details for Bed Mobility Assistance: Pt OOB in recliner Transfers Transfers: Sit to Stand;Stand to Sit Sit to Stand: 4: Min assist;From chair/3-in-1;From toilet Stand to Sit: 4: Min assist;To chair/3-in-1 Details for Transfer Assistance: 25% VC's on proper tech and hand placement plus to extend L LE prior to sit. Ambulation/Gait Ambulation/Gait Assistance: 4: Min assist Ambulation Distance (Feet): 42 Feet Assistive device: Rolling walker Ambulation/Gait Assistance Details: 25% VC's on sequencing and proper walker to self distance as pt tended to step up too far to the front of the walker decreasing her BOS. Gait Pattern:  Step-to pattern;Decreased stance time - left;Trunk flexed Gait velocity: decreased     PT Goals                                       progressing    Visit Information  Last PT Received On: 11/26/12 Assistance Needed: +1    Subjective Data      Cognition       Balance     End of Session PT - End of Session Equipment Utilized During Treatment: Gait belt Activity Tolerance: Patient limited by fatigue Patient left: in chair;with call bell/phone within reach (ICE to L knee)   Felecia Shelling  PTA [facility]  Acute  Rehab Pager     [phone]

## 2012-11-26 NOTE — Progress Notes (Signed)
Clinical Social Work Department BRIEF PSYCHOSOCIAL ASSESSMENT 11/26/2012  Patient:  Margaret Zhang, Margaret Zhang     Account Number:  [id number]     Admit date:  11/23/2012  Clinical Social Worker:  Doroteo Glassman  Date/Time:  11/26/2012 10:34 AM  Referred by:  Physician  Date Referred:  11/26/2012 Referred for  SNF Placement   Other Referral:   Interview type:  Patient Other interview type:    PSYCHOSOCIAL DATA Living Status:  FAMILY Admitted from facility:   Level of care:   Primary support name:  Kandis Mannan Primary support relationship to patient:  SIBLING Degree of support available:   adequate    CURRENT CONCERNS Current Concerns  Post-Acute Placement   Other Concerns:    SOCIAL WORK ASSESSMENT / PLAN Met with Pt to discuss d/c plans.    Pt stated that she realizes that she will need SNF placement upon d/c.  She stated that her brother has been to [facility] and that her friends are encouraging her to go to [facility].    Pt gave CSW permission to send her information to all [employer].    CSW thanked Pt for her time.   Assessment/plan status:  Psychosocial Support/Ongoing Assessment of Needs Other assessment/ plan:   Information/referral to community resources:   Will give with bed offers.    PATIENT'S/FAMILY'S RESPONSE TO PLAN OF CARE: Pt thanked CSW for time and assistance.   CSW to continue to follow.  Providence Crosby, LCSWA Clinical Social Work [phone]

## 2012-11-26 NOTE — Progress Notes (Signed)
Orthopedics Progress Note  Subjective: Pt emotional today about trying to go home Pt not comfortable with her 68 y/o mother as her care giver at home and would like to look at rehab options Mild pain to left knee but slow with mobilization  Objective:  Filed Vitals:   11/26/12 0632  BP: 115/64  Pulse: 73  Temp: 97.8 F (36.6 C)  Resp: 18    General: Awake and alert  Musculoskeletal: left knee incision healing well, nv intact distally Neurovascularly intact  Lab Results  Component Value Date   WBC 8.0 11/26/2012   HGB 9.7* 11/26/2012   HCT 29.6* 11/26/2012   MCV 83.9 11/26/2012   PLT 237 11/26/2012       Component Value Date/Time   NA 134* 11/26/2012 0550   K 4.2 11/26/2012 0550   CL 98 11/26/2012 0550   CO2 27 11/26/2012 0550   GLUCOSE 126* 11/26/2012 0550   BUN 10 11/26/2012 0550   CREATININE 0.56 11/26/2012 0550   CALCIUM 8.8 11/26/2012 0550   GFRNONAA >90 11/26/2012 0550   GFRAA >90 11/26/2012 0550    Lab Results  Component Value Date   INR 0.91 11/17/2012    Assessment/Plan: POD #3 s/p Procedure(s):left  TOTAL KNEE ARTHROPLASTY  Will have social work discuss rehab options  Patient in agreement Continue PT/OT Pain control  Almedia Balls. Ranell Patrick, MD 11/26/2012 7:13 AM

## 2012-11-26 NOTE — Plan of Care (Signed)
Problem: Discharge Progression Outcomes Goal: Pain controlled with appropriate interventions Outcome: Progressing Pt on po pain meds

## 2012-11-26 NOTE — Progress Notes (Signed)
OT Note:  Attempted to see pt earlier today. She first needed pain medicine and then was with PT.  Plan is now SNF.  Will likely check back on Monday.  Bertrand, OTR/L [phone] 11/26/2012

## 2012-11-27 LAB — GLUCOSE, CAPILLARY
Glucose-Capillary: 148 mg/dL — ABNORMAL HIGH (ref 70–99)
Glucose-Capillary: 156 mg/dL — ABNORMAL HIGH (ref 70–99)

## 2012-11-27 MED ORDER — LOPERAMIDE HCL 2 MG PO CAPS
2.0000 mg | ORAL_CAPSULE | ORAL | Status: DC | PRN
  Administered 2012-11-27: 2 mg via ORAL
  Filled 2012-11-27: qty 1

## 2012-11-27 MED ORDER — LOPERAMIDE HCL 2 MG PO CAPS: 2.0000 mg | ORAL_CAPSULE | ORAL | Status: DC | PRN

## 2012-11-27 NOTE — Progress Notes (Signed)
Subjective: 4 Days Post-Op Procedure(s) (LRB): TOTAL KNEE ARTHROPLASTY (Left) Patient reports pain as mild.  Well controlled with oral pain meds.  Awaiting SNF placement.   Objective: Vital signs in last 24 hours: Temp:  [97.8 F (36.6 C)-98.8 F (37.1 C)] 98.5 F (36.9 C) (12/01 1313) Pulse Rate:  [79-84] 79  (12/01 1313) Resp:  [18] 18  (12/01 1313) BP: (101-123)/(60-71) 114/69 mmHg (12/01 1313) SpO2:  [91 %-94 %] 94 % (12/01 1313)  Intake/Output from previous day: 11/30 0701 - 12/01 0700 In: 480 [P.O.:480] Out: 1350 [Urine:1350] Intake/Output this shift: Total I/O In: 240 [P.O.:240] Out: 400 [Urine:400]   Basename 11/26/12 0550 11/25/12 0417  HGB 9.7* 9.1*    Basename 11/26/12 0550 11/25/12 0417  WBC 8.0 9.2  RBC 3.53* 3.34*  HCT 29.6* 28.1*  PLT 237 235    Basename 11/26/12 0550 11/25/12 0417  NA 134* 130*  K 4.2 3.4*  CL 98 95*  CO2 27 26  BUN 10 12  CREATININE 0.56 0.70  GLUCOSE 126* 182*  CALCIUM 8.8 8.7   No results found for this basename: LABPT:2,INR:2 in the last 72 hours  R knee incision CDI.  NVI R LE.  Assessment/Plan: 4 Days Post-Op Procedure(s) (LRB): TOTAL KNEE ARTHROPLASTY (Left) Discharge to SNF likely tomorrow or Tuesday.  Margaret Zhang 11/27/2012, 2:55 PM

## 2012-11-27 NOTE — Progress Notes (Signed)
Physical Therapy Treatment Patient Details Name: Margaret Zhang MRN: [id number] DOB: 10-31-1946 Today's Date: 11/27/2012 Time: 5284-1324 PT Time Calculation (min): 8 min  PT Assessment / Plan / Recommendation Comments on Treatment Session  L TKA ther ex completed. 8/10 pain with knee flexion. Knee flexion AAROM approx. 35*.      Follow Up Recommendations  SNF     Does the patient have the potential to tolerate intense rehabilitation     Barriers to Discharge        Equipment Recommendations       Recommendations for Other Services    Frequency 7X/week   Plan Discharge plan remains appropriate    Precautions / Restrictions Precautions Precautions: Knee Precaution Comments: Instructed pt on KI use for amb Required Braces or Orthoses: Knee Immobilizer - Left Knee Immobilizer - Left: Discontinue once straight leg raise with < 10 degree lag Restrictions Weight Bearing Restrictions: No Other Position/Activity Restrictions: WBAT   Pertinent Vitals/Pain **8/10 L knee with therapeutic exercise Pain meds requested*       Exercises Total Joint Exercises Ankle Circles/Pumps: AROM;Both;10 reps;Supine Quad Sets: AROM;Both;10 reps;Supine Towel Squeeze: AROM;Both;10 reps;Supine Short Arc Quad: AAROM;Left;10 reps;Supine Heel Slides: AAROM;Left;10 reps;Supine Hip ABduction/ADduction: AAROM;Left;10 reps;Supine Straight Leg Raises: AAROM;Left;10 reps;Supine   PT Diagnosis:    PT Problem List:   PT Treatment Interventions:     PT Goals Acute Rehab PT Goals PT Goal Formulation: With patient Time For Goal Achievement: 11/30/12 Potential to Achieve Goals: Good  Pt will Perform Home Exercise Program: Independently PT Goal: Perform Home Exercise Program - Progress: Progressing toward goal  Visit Information  Last PT Received On: 11/27/12 Assistance Needed: +1    Subjective Data  Subjective: My stomach is feeling better.  Patient Stated Goal: DC to SNF   Cognition  Overall  Cognitive Status: Appears within functional limits for tasks assessed/performed Arousal/Alertness: Awake/alert Orientation Level: Appears intact for tasks assessed Behavior During Session: [facility] for tasks performed    Balance     End of Session PT - End of Session Equipment Utilized During Treatment: Gait belt Activity Tolerance: Patient limited by fatigue;Patient limited by pain Patient left: in chair;with call bell/phone within reach (ICE to L knee)   GP     Tamala Ser 11/27/2012, 1:50 PM

## 2012-11-27 NOTE — Plan of Care (Signed)
Problem: Discharge Progression Outcomes Goal: Discharge plan in place and appropriate Outcome: Completed/Met Date Met:  11/27/12 For SNF

## 2012-11-27 NOTE — Progress Notes (Signed)
Physical Therapy Treatment Patient Details Name: Margaret Zhang MRN: [id number] DOB: [birth year] Today's Date: 11/27/2012 Time: 4098-1191 PT Time Calculation (min): 26 min  PT Assessment / Plan / Recommendation Comments on Treatment Session  POD #5 for L TKA. Pt ambulated to/from bathroom where she had large, loose, black BM partially on floor prior to reaching 3-in-1.  Pt reported stomach cramping and stated that the iron pills were upsetting her stomach. RN notified. Ther ex for LLE completed.  Activity tolerance limited by stomach discomfort.     Follow Up Recommendations  SNF     Does the patient have the potential to tolerate intense rehabilitation     Barriers to Discharge        Equipment Recommendations       Recommendations for Other Services    Frequency 7X/week   Plan Discharge plan remains appropriate    Precautions / Restrictions Precautions Precautions: Knee Precaution Comments: Instructed opt on KI use for amb Required Braces or Orthoses: Knee Immobilizer - Left Knee Immobilizer - Left: Discontinue once straight leg raise with < 10 degree lag Restrictions Weight Bearing Restrictions: No Other Position/Activity Restrictions: WBAT   Pertinent Vitals/Pain **8/10 L knee with activity; 4-5/10 at rest Ice applied, premedicated*    Mobility  Bed Mobility Bed Mobility: Supine to Sit Supine to Sit: 4: Min assist Details for Bed Mobility Assistance: min A to support RLE Transfers Transfers: Sit to Stand;Stand to Sit Sit to Stand: 4: Min guard;From chair/3-in-1;From bed Stand to Sit: 4: Min guard;To chair/3-in-1 Details for Transfer Assistance: assisted pt to bathroom, pt had sudden urge to have BM, brought 3-in-1 to pt who had large, black loose stool, assisted pt with pericare Ambulation/Gait Ambulation/Gait Assistance: 4: Min guard Ambulation Distance (Feet): 24 Feet (12' x 2) Assistive device: Rolling walker Gait Pattern: Step-to pattern;Decreased stance time -  left Gait velocity: decreased General Gait Details: distance limited by stomach cramping, RN notified     Exercises Total Joint Exercises Ankle Circles/Pumps: AROM;Both;10 reps;Supine Quad Sets: AROM;Both;10 reps;Supine Short Arc Quad: AAROM;Left;10 reps;Supine Heel Slides: AAROM;Left;10 reps;Supine Hip ABduction/ADduction: AAROM;Left;10 reps;Supine   PT Diagnosis:    PT Problem List:   PT Treatment Interventions:     PT Goals Acute Rehab PT Goals PT Goal Formulation: With patient Time For Goal Achievement: 11/30/12 Potential to Achieve Goals: Good Pt will go Supine/Side to Sit: with modified independence PT Goal: Supine/Side to Sit - Progress: Progressing toward goal Pt will go Sit to Stand: with modified independence;with upper extremity assist PT Goal: Sit to Stand - Progress: Progressing toward goal Pt will Ambulate: >150 feet;with modified independence;with rolling walker PT Goal: Ambulate - Progress: Not progressing (stomach discomfort limiting gait tolerance) Pt will Go Up / Down Stairs: 1-2 stairs;with min assist;with rail(s) Pt will Perform Home Exercise Program: Independently PT Goal: Perform Home Exercise Program - Progress: Progressing toward goal  Visit Information  Last PT Received On: 11/27/12 Assistance Needed: +1    Subjective Data  Subjective: The iron pills are tearing up my stomach.  Patient Stated Goal: go to rehab then home   Cognition  Overall Cognitive Status: Appears within functional limits for tasks assessed/performed Arousal/Alertness: Awake/alert Orientation Level: Appears intact for tasks assessed Behavior During Session: [facility] for tasks performed    Balance     End of Session PT - End of Session Equipment Utilized During Treatment: Gait belt Activity Tolerance: Patient limited by fatigue;Patient limited by pain Patient left: in chair;with call bell/phone within reach (ICE to  L knee)   GP     Ralene Bathe Kistler 11/27/2012,  12:46 PM [phone]

## 2012-11-28 LAB — GLUCOSE, CAPILLARY
Glucose-Capillary: 142 mg/dL — ABNORMAL HIGH (ref 70–99)
Glucose-Capillary: 128 mg/dL — ABNORMAL HIGH (ref 70–99)

## 2012-11-28 NOTE — Discharge Summary (Signed)
Physician Discharge Summary   Patient ID: ORLA ESTRIN MRN: [id number] DOB/AGE: 68-May-1947 68 y.o.  Admit date: 11/23/2012 Discharge date: 11/28/2012  Primary Diagnosis:  Osteoarthritis, left knee  Admission Diagnoses:  Past Medical History  Diagnosis Date  . Hypertension   . Diabetes mellitus without complication   . GERD (gastroesophageal reflux disease)   . Arthritis    Discharge Diagnoses:   Active Problems:  Osteoarthritis of knee  Hyponatremia  Hypokalemia  Acute blood loss anemia S/p left total knee arthroplasty  Estimated Body mass index is 35.16 kg/(m^2) as calculated from the following:   Height as of this encounter: 5\' 4" (1.626 m).   Weight as of this encounter: 204 lb 12.9 oz(92.9 kg).  Classification of overweight in adults according to BMI (WHO, 1998)   Procedure:  Procedure(s) (LRB): TOTAL KNEE ARTHROPLASTY (Left)   Consults: None  HPI: BREDA BOND, 66 y.o. female, has a history of pain and functional disability in the left knee due to arthritis and has failed non-surgical conservative treatments for greater than 12 weeks to includeNSAID's and/or analgesics, corticosteriod injections, viscosupplementation injections, flexibility and strengthening excercises and activity modification. Onset of symptoms was gradual, starting 3 years ago with gradually worsening course since that time. The patient noted prior procedures on the knee to include arthroscopy and menisectomy on the left knee(s). Patient currently rates pain in the left knee(s) at 7 out of 10 with activity. Patient has night pain, worsening of pain with activity and weight bearing, pain that interferes with activities of daily living, pain with passive range of motion, crepitus and joint swelling. Patient has evidence of periarticular osteophytes and joint space narrowing by imaging studies. There is no active infection.    Laboratory Data: Admission on 11/23/2012  Component Date Value Range Status    . ABO/RH(D) 11/23/2012 A NEG   Final  . Antibody Screen 11/23/2012 NEG   Final  . Sample Expiration 11/23/2012 11/26/2012   Final  . Glucose-Capillary 11/23/2012 133* 70 - 99 mg/dL Final  . Comment 1 40/98/1191 Documented in Chart   Final  . ABO/RH(D) 11/23/2012 A NEG   Final  . Glucose-Capillary 11/23/2012 168* 70 - 99 mg/dL Final  . Comment 1 47/82/9562 Documented in Chart   Final  . Comment 2 11/23/2012 Notify RN   Final  . WBC 11/24/2012 7.7  4.0 - 10.5 K/uL Final  . RBC 11/24/2012 3.52* 3.87 - 5.11 MIL/uL Final  . Hemoglobin 11/24/2012 9.7* 12.0 - 15.0 g/dL Final  . HCT 13/07/6577 29.7* 36.0 - 46.0 % Final  . MCV 11/24/2012 84.4  78.0 - 100.0 fL Final  . MCH 11/24/2012 27.6  26.0 - 34.0 pg Final  . MCHC 11/24/2012 32.7  30.0 - 36.0 g/dL Final  . RDW 46/96/2952 15.3  11.5 - 15.5 % Final  . Platelets 11/24/2012 250  150 - 400 K/uL Final  . Sodium 11/24/2012 132* 135 - 145 mEq/L Final  . Potassium 11/24/2012 4.2  3.5 - 5.1 mEq/L Final   SLIGHT HEMOLYSIS  . Chloride 11/24/2012 97  96 - 112 mEq/L Final  . CO2 11/24/2012 24  19 - 32 mEq/L Final  . Glucose, Bld 11/24/2012 137* 70 - 99 mg/dL Final  . BUN 84/13/2440 10  6 - 23 mg/dL Final  . Creatinine, Ser 11/24/2012 0.58  0.50 - 1.10 mg/dL Final  . Calcium 10/24/2535 8.6  8.4 - 10.5 mg/dL Final  . GFR calc non Af Amer 11/24/2012 >90  >90 mL/min Final  .  GFR calc Af Amer 11/24/2012 >90  >90 mL/min Final   Comment:                                 The eGFR has been calculated                          using the CKD EPI equation.                          This calculation has not been                          validated in all clinical                          situations.                          eGFR's persistently                          <90 mL/min signify                          possible Chronic Kidney Disease.  . Glucose-Capillary 11/23/2012 235* 70 - 99 mg/dL Final  . Glucose-Capillary 11/23/2012 187* 70 - 99 mg/dL Final  .  Glucose-Capillary 11/24/2012 156* 70 - 99 mg/dL Final  . Glucose-Capillary 11/24/2012 168* 70 - 99 mg/dL Final  . WBC 16/09/9603 9.2  4.0 - 10.5 K/uL Final  . RBC 11/25/2012 3.34* 3.87 - 5.11 MIL/uL Final  . Hemoglobin 11/25/2012 9.1* 12.0 - 15.0 g/dL Final  . HCT 54/08/8118 28.1* 36.0 - 46.0 % Final  . MCV 11/25/2012 84.1  78.0 - 100.0 fL Final  . MCH 11/25/2012 27.2  26.0 - 34.0 pg Final  . MCHC 11/25/2012 32.4  30.0 - 36.0 g/dL Final  . RDW 14/78/2956 15.3  11.5 - 15.5 % Final  . Platelets 11/25/2012 235  150 - 400 K/uL Final  . Sodium 11/25/2012 130* 135 - 145 mEq/L Final  . Potassium 11/25/2012 3.4* 3.5 - 5.1 mEq/L Final   Comment: DELTA CHECK NOTED                          REPEATED TO VERIFY  . Chloride 11/25/2012 95* 96 - 112 mEq/L Final  . CO2 11/25/2012 26  19 - 32 mEq/L Final  . Glucose, Bld 11/25/2012 182* 70 - 99 mg/dL Final  . BUN 21/30/8657 12  6 - 23 mg/dL Final  . Creatinine, Ser 11/25/2012 0.70  0.50 - 1.10 mg/dL Final  . Calcium 84/69/6295 8.7  8.4 - 10.5 mg/dL Final  . GFR calc non Af Amer 11/25/2012 88* >90 mL/min Final  . GFR calc Af Amer 11/25/2012 >90  >90 mL/min Final   Comment:                                 The eGFR has been calculated                          using the CKD EPI equation.  This calculation has not been                          validated in all clinical                          situations.                          eGFR's persistently                          <90 mL/min signify                          possible Chronic Kidney Disease.  . Glucose-Capillary 11/24/2012 148* 70 - 99 mg/dL Final  . Glucose-Capillary 11/24/2012 139* 70 - 99 mg/dL Final  . Glucose-Capillary 11/25/2012 141* 70 - 99 mg/dL Final  . Glucose-Capillary 11/25/2012 134* 70 - 99 mg/dL Final  . Glucose-Capillary 11/25/2012 135* 70 - 99 mg/dL Final  . WBC 40/98/1191 8.0  4.0 - 10.5 K/uL Final  . RBC 11/26/2012 3.53* 3.87 - 5.11 MIL/uL Final  .  Hemoglobin 11/26/2012 9.7* 12.0 - 15.0 g/dL Final  . HCT 47/82/9562 29.6* 36.0 - 46.0 % Final  . MCV 11/26/2012 83.9  78.0 - 100.0 fL Final  . MCH 11/26/2012 27.5  26.0 - 34.0 pg Final  . MCHC 11/26/2012 32.8  30.0 - 36.0 g/dL Final  . RDW 13/07/6577 15.2  11.5 - 15.5 % Final  . Platelets 11/26/2012 237  150 - 400 K/uL Final  . Sodium 11/26/2012 134* 135 - 145 mEq/L Final  . Potassium 11/26/2012 4.2  3.5 - 5.1 mEq/L Final   Comment: NO VISIBLE HEMOLYSIS                          DELTA CHECK NOTED  . Chloride 11/26/2012 98  96 - 112 mEq/L Final  . CO2 11/26/2012 27  19 - 32 mEq/L Final  . Glucose, Bld 11/26/2012 126* 70 - 99 mg/dL Final  . BUN 46/96/2952 10  6 - 23 mg/dL Final  . Creatinine, Ser 11/26/2012 0.56  0.50 - 1.10 mg/dL Final  . Calcium 84/13/2440 8.8  8.4 - 10.5 mg/dL Final  . GFR calc non Af Amer 11/26/2012 >90  >90 mL/min Final  . GFR calc Af Amer 11/26/2012 >90  >90 mL/min Final   Comment:                                 The eGFR has been calculated                          using the CKD EPI equation.                          This calculation has not been                          validated in all clinical                          situations.  eGFR's persistently                          <90 mL/min signify                          possible Chronic Kidney Disease.  . Glucose-Capillary 11/25/2012 144* 70 - 99 mg/dL Final  . Glucose-Capillary 11/25/2012 157* 70 - 99 mg/dL Final  . Glucose-Capillary 11/26/2012 127* 70 - 99 mg/dL Final  . Glucose-Capillary 11/26/2012 124* 70 - 99 mg/dL Final  . Glucose-Capillary 11/26/2012 122* 70 - 99 mg/dL Final  . Glucose-Capillary 11/26/2012 164* 70 - 99 mg/dL Final  . Glucose-Capillary 11/27/2012 148* 70 - 99 mg/dL Final  . Glucose-Capillary 11/27/2012 173* 70 - 99 mg/dL Final  . Glucose-Capillary 11/27/2012 156* 70 - 99 mg/dL Final  Hospital Outpatient Visit on 11/17/2012  Component Date Value Range Status  .  WBC 11/17/2012 5.8  4.0 - 10.5 K/uL Final  . RBC 11/17/2012 4.28  3.87 - 5.11 MIL/uL Final  . Hemoglobin 11/17/2012 11.7* 12.0 - 15.0 g/dL Final  . HCT 09/81/1914 36.0  36.0 - 46.0 % Final  . MCV 11/17/2012 84.1  78.0 - 100.0 fL Final  . MCH 11/17/2012 27.3  26.0 - 34.0 pg Final  . MCHC 11/17/2012 32.5  30.0 - 36.0 g/dL Final  . RDW 78/29/5621 15.0  11.5 - 15.5 % Final  . Platelets 11/17/2012 340  150 - 400 K/uL Final  . MRSA, PCR 11/17/2012 NEGATIVE  NEGATIVE Final  . Staphylococcus aureus 11/17/2012 NEGATIVE  NEGATIVE Final   Comment:                                 The Xpert SA Assay (FDA                          approved for NASAL specimens                          in patients over 35 years of age),                          is one component of                          a comprehensive surveillance                          program.  Test performance has                          been validated by [employer] for patients greater                          than or equal to 65 year old.                          It is not intended  to diagnose infection nor to                          guide or monitor treatment.  Marland Kitchen aPTT 11/17/2012 36  24 - 37 seconds Final  . Sodium 11/17/2012 139  135 - 145 mEq/L Final  . Potassium 11/17/2012 4.2  3.5 - 5.1 mEq/L Final  . Chloride 11/17/2012 103  96 - 112 mEq/L Final  . CO2 11/17/2012 27  19 - 32 mEq/L Final  . Glucose, Bld 11/17/2012 133* 70 - 99 mg/dL Final  . BUN 54/08/8118 13  6 - 23 mg/dL Final  . Creatinine, Ser 11/17/2012 0.58  0.50 - 1.10 mg/dL Final  . Calcium 14/78/2956 9.4  8.4 - 10.5 mg/dL Final  . Total Protein 11/17/2012 6.5  6.0 - 8.3 g/dL Final  . Albumin 21/30/8657 3.3* 3.5 - 5.2 g/dL Final  . AST 84/69/6295 19  0 - 37 U/L Final  . ALT 11/17/2012 19  0 - 35 U/L Final  . Alkaline Phosphatase 11/17/2012 72  39 - 117 U/L Final  . Total Bilirubin 11/17/2012 0.3  0.3 - 1.2 mg/dL Final  .  GFR calc non Af Amer 11/17/2012 >90  >90 mL/min Final  . GFR calc Af Amer 11/17/2012 >90  >90 mL/min Final   Comment:                                 The eGFR has been calculated                          using the CKD EPI equation.                          This calculation has not been                          validated in all clinical                          situations.                          eGFR's persistently                          <90 mL/min signify                          possible Chronic Kidney Disease.  Marland Kitchen Prothrombin Time 11/17/2012 12.2  11.6 - 15.2 seconds Final  . INR 11/17/2012 0.91  0.00 - 1.49 Final  . Color, Urine 11/17/2012 YELLOW  YELLOW Final  . APPearance 11/17/2012 CLOUDY* CLEAR Final  . Specific Gravity, Urine 11/17/2012 1.022  1.005 - 1.030 Final  . pH 11/17/2012 5.0  5.0 - 8.0 Final  . Glucose, UA 11/17/2012 NEGATIVE  NEGATIVE mg/dL Final  . Hgb urine dipstick 11/17/2012 NEGATIVE  NEGATIVE Final  . Bilirubin Urine 11/17/2012 NEGATIVE  NEGATIVE Final  . Ketones, ur 11/17/2012 NEGATIVE  NEGATIVE mg/dL Final  . Protein, ur 28/41/3244 NEGATIVE  NEGATIVE mg/dL Final  . Urobilinogen, UA 11/17/2012 0.2  0.0 - 1.0 mg/dL Final  . Nitrite 12/30/7251 NEGATIVE  NEGATIVE Final  . Leukocytes, UA  11/17/2012 NEGATIVE  NEGATIVE Final   MICROSCOPIC NOT DONE ON URINES WITH NEGATIVE PROTEIN, BLOOD, LEUKOCYTES, NITRITE, OR GLUCOSE <1000 mg/dL.     X-Rays:Dg Chest 2 View  11/17/2012  *RADIOLOGY REPORT*  Clinical Data: Preop for left knee surgery  CHEST - 2 VIEW  Comparison: Chest x-ray of 03/05/2008  Findings: No active infiltrate or effusion is seen.  Mediastinal contours are stable.  The heart is within upper limits of normal. No bony abnormality is seen.  IMPRESSION: Stable chest x-ray.  No active lung disease.   Original Report Authenticated By: Dwyane Dee, M.D.    Dg Knee Left Port  11/23/2012  *RADIOLOGY REPORT*  Clinical Data: Left knee replacement  PORTABLE LEFT KNEE  - 1-2 VIEW  Comparison: 08/22/2010  Findings: Left knee replacement has been performed.  Components appear aligned.  No hardware or osseous abnormality.  Surgical drain in the suprapatellar region.  Scattered air throughout the soft tissues.  IMPRESSION: Expected appearance following left knee arthroplasty.   Original Report Authenticated By: Judie Petit. Shick, M.D.     EKG: Orders placed during the hospital encounter of 11/17/12  . EKG 12-LEAD  . EKG 12-LEAD     Hospital Course: Margaret Zhang is a 66 y.o. who was admitted to [facility]. They were brought to the operating room on 11/23/2012 and underwent Procedure(s): TOTAL KNEE ARTHROPLASTY.  Patient tolerated the procedure well and was later transferred to the recovery room and then to the orthopaedic floor for postoperative care.  They were given PO and IV analgesics for pain control following their surgery.  They were given 24 hours of postoperative antibiotics of  Anti-infectives     Start     Dose/Rate Route Frequency Ordered Stop   11/23/12 1400   ceFAZolin (ANCEF) IVPB 1 g/50 mL premix        1 g 100 mL/hr over 30 Minutes Intravenous Every 6 hours 11/23/12 1224 11/23/12 2011   11/23/12 0807   polymyxin B 500,000 Units, bacitracin 50,000 Units in sodium chloride irrigation 0.9 % 500 mL irrigation  Status:  Discontinued          As needed 11/23/12 0807 11/23/12 0958   11/23/12 0523   ceFAZolin (ANCEF) IVPB 2 g/50 mL premix        2 g 100 mL/hr over 30 Minutes Intravenous 60 min pre-op 11/23/12 0523 11/23/12 0744         and started on DVT prophylaxis in the form of Xarelto.   PT and OT were ordered for total joint protocol.  Discharge planning consulted to help with postop disposition and equipment needs.  Patient had a decent night on the evening of surgery and started to get up OOB with therapy on day one. Hemovac drain was pulled without difficulty.  Continued to work with therapy into day two.  Dressing was changed on day two and  the incision was clean and dry.  Post op day three, she decided that she would be unable to go home and needed to placed in SNF. On day four, she was up walking with therapy and awaiting SNF placement. By day five, the patient had progressed with therapy and meeting their goals.  Incision was healing well.  Patient was seen in rounds and was ready to go to SNF.    Discharge Medications: Prior to Admission medications   Medication Sig Start Date End Date Taking? Authorizing Provider  amLODipine (NORVASC) 5 MG tablet Take 5 mg by mouth daily before  breakfast.   Yes Historical Provider, MD  atorvastatin (LIPITOR) 80 MG tablet Take 80 mg by mouth at bedtime.   Yes Historical Provider, MD  estradiol (VIVELLE-DOT) 0.05 MG/24HR Place 1 patch onto the skin 2 (two) times a week. Sunday and wednesday   Yes Historical Provider, MD  fish oil-omega-3 fatty acids 1000 MG capsule Take 1 g by mouth 3 (three) times daily.   Yes Historical Provider, MD  Glucosamine-Chondroit-Vit C-Mn (GLUCOSAMINE 1500 COMPLEX PO) Take 1 tablet by mouth 3 (three) times daily.   Yes Historical Provider, MD  lisinopril-hydrochlorothiazide (PRINZIDE,ZESTORETIC) 20-25 MG per tablet Take 1 tablet by mouth daily before breakfast.   Yes Historical Provider, MD  metFORMIN (GLUCOPHAGE) 500 MG tablet Take 500-1,000 mg by mouth 2 (two) times daily with a meal. Takes 2 tablets in the morning and 1 at night   Yes Historical Provider, MD  Multiple Vitamin (MULTIVITAMIN WITH MINERALS) TABS Take 1 tablet by mouth daily.   Yes Historical Provider, MD  omeprazole (PRILOSEC OTC) 20 MG tablet Take 20 mg by mouth daily.   Yes Historical Provider, MD  Vitamin D, Ergocalciferol, (DRISDOL) 50000 UNITS CAPS Take 50,000 Units by mouth every 7 (seven) days. wednesday   Yes Historical Provider, MD  methocarbamol (ROBAXIN) 500 MG tablet Take 1 tablet (500 mg total) by mouth every 6 (six) hours as needed. 11/24/12   Jacki Cones, MD  oxyCODONE-acetaminophen  (PERCOCET/ROXICET) 5-325 MG per tablet Take 2 tablets by mouth every 4 (four) hours as needed (Q4-6 hours PRN). 11/24/12   Jacki Cones, MD  rivaroxaban (XARELTO) 10 MG TABS tablet Take 1 tablet (10 mg total) by mouth daily with breakfast. 11/24/12   Jacki Cones, MD    Diet: Diabetic diet Activity:WBAT Follow-up:in 2 weeks Disposition - Skilled nursing facility Discharged Condition: fair   Discharge Orders    Future Orders Please Complete By Expires   Call MD / Call 911      Comments:   If you experience chest pain or shortness of breath, CALL 911 and be transported to the hospital emergency room.  If you develope a fever above 101 F, pus (white drainage) or increased drainage or redness at the wound, or calf pain, call your surgeon's office.   Increase activity slowly as tolerated      Discharge instructions      Comments:   Walk with your walker. Weight bearing as instructed. Home Health Agency will follow you at home for your therapy and to manage your Coumadin. Change your dressing daily. Shower only, no tub bath. Call if any temperatures greater than 101 or any wound complications: [phone] during the day and ask for Dr. Jeannetta Ellis nurse, [facility].   Driving restrictions      Comments:   No driving for 4 weeks       Medication List     As of 11/28/2012  7:21 AM    TAKE these medications         amLODipine 5 MG tablet   Commonly known as: NORVASC   Take 5 mg by mouth daily before breakfast.      atorvastatin 80 MG tablet   Commonly known as: LIPITOR   Take 80 mg by mouth at bedtime.      estradiol 0.05 MG/24HR   Commonly known as: VIVELLE-DOT   Place 1 patch onto the skin 2 (two) times a week. Sunday and wednesday      fish oil-omega-3 fatty acids 1000 MG capsule  Take 1 g by mouth 3 (three) times daily.      GLUCOSAMINE 1500 COMPLEX PO   Take 1 tablet by mouth 3 (three) times daily.      lisinopril-hydrochlorothiazide 20-25 MG per tablet    Commonly known as: PRINZIDE,ZESTORETIC   Take 1 tablet by mouth daily before breakfast.      metFORMIN 500 MG tablet   Commonly known as: GLUCOPHAGE   Take 500-1,000 mg by mouth 2 (two) times daily with a meal. Takes 2 tablets in the morning and 1 at night      methocarbamol 500 MG tablet   Commonly known as: ROBAXIN   Take 1 tablet (500 mg total) by mouth every 6 (six) hours as needed.      multivitamin with minerals Tabs   Take 1 tablet by mouth daily.      omeprazole 20 MG tablet   Commonly known as: PRILOSEC OTC   Take 20 mg by mouth daily.      oxyCODONE-acetaminophen 5-325 MG per tablet   Commonly known as: PERCOCET/ROXICET   Take 2 tablets by mouth every 4 (four) hours as needed (Q4-6 hours PRN).      rivaroxaban 10 MG Tabs tablet   Commonly known as: XARELTO   Take 1 tablet (10 mg total) by mouth daily with breakfast.      Vitamin D (Ergocalciferol) 50000 UNITS Caps   Commonly known as: DRISDOL   Take 50,000 Units by mouth every 7 (seven) days. wednesday         SignedCeledonio Savage, Orry Sigl LAUREN 11/28/2012, 7:21 AM

## 2012-11-28 NOTE — Progress Notes (Signed)
Subjective: 5 Days Post-Op Procedure(s) (LRB): TOTAL KNEE ARTHROPLASTY (Left) Patient reports pain as 3 on 0-10 scale.  Dressing changed and wound looks fine. Awaiting SNF. She decided that she could not go home when we planned to DC her  Objective: Vital signs in last 24 hours: Temp:  [98.4 F (36.9 C)-99.3 F (37.4 C)] 98.4 F (36.9 C) (12/02 0530) Pulse Rate:  [77-83] 83  (12/02 0530) Resp:  [16-18] 18  (12/02 0530) BP: (97-114)/(48-76) 112/76 mmHg (12/02 0530) SpO2:  [91 %-94 %] 93 % (12/02 0530)  Intake/Output from previous day: 12/01 0701 - 12/02 0700 In: 480 [P.O.:480] Out: 700 [Urine:700] Intake/Output this shift:     Basename 11/26/12 0550  HGB 9.7*    Basename 11/26/12 0550  WBC 8.0  RBC 3.53*  HCT 29.6*  PLT 237    Basename 11/26/12 0550  NA 134*  K 4.2  CL 98  CO2 27  BUN 10  CREATININE 0.56  GLUCOSE 126*  CALCIUM 8.8   No results found for this basename: LABPT:2,INR:2 in the last 72 hours  Dorsiflexion/Plantar flexion intact No cellulitis present  Assessment/Plan: 5 Days Post-Op Procedure(s) (LRB): TOTAL KNEE ARTHROPLASTY (Left) Up with [facility]  Kiyona Mcnall A 11/28/2012, 7:17 AM

## 2012-11-28 NOTE — Progress Notes (Signed)
Physical Therapy Treatment Patient Details Name: CHI WOODHAM MRN: [id number] DOB: 1946/03/04 Today's Date: 11/28/2012 Time: 5284-1324 PT Time Calculation (min): 27 min  PT Assessment / Plan / Recommendation Comments on Treatment Session  POD # 5 L TKR am session.  Assisted pt out of recliner to amb in hallway, assisted to BR then back to bed per pt request to rest.  Pt plans to D/C to [facility] today for ST Rehab.    Follow Up Recommendations  SNF     Does the patient have the potential to tolerate intense rehabilitation     Barriers to Discharge        Equipment Recommendations  Other (comment)    Recommendations for Other Services    Frequency 7X/week   Plan Discharge plan remains appropriate    Precautions / Restrictions Precautions Precautions: None Precaution Comments: Instructed pt on KI use for amb  Required Braces or Orthoses: Knee Immobilizer - Left Knee Immobilizer - Left: Discontinue once straight leg raise with < 10 degree lag Restrictions Weight Bearing Restrictions: No Other Position/Activity Restrictions: WBAT   Pertinent Vitals/Pain C/o "soreness' and fatigue    Mobility  Bed Mobility Sit to Supine: 4: Min assist Details for Bed Mobility Assistance: Assisted pt back to bed with increased time and Min assist to support L LE up onto bed Transfers Transfers: Sit to Stand;Stand to Sit Sit to Stand: 4: Min guard;From chair/3-in-1;From toilet Stand to Sit: 4: Min guard;To toilet;To bed Details for Transfer Assistance: Assisted out of recliner to amb in hallway then to BR to void then back to bed per pt request to rest. Ambulation/Gait Ambulation/Gait Assistance: 4: Min guard Ambulation Distance (Feet): 58 Feet Assistive device: Rolling walker Ambulation/Gait Assistance Details: <25% VC's on sequencing with turns and safety in reguards to proper walker to self distance to decrease posterior LOB. Gait Pattern: Step-to pattern;Decreased stance time -  left;Trunk flexed Gait velocity: decreased     PT Goals                                                       progressing   Visit Information  Last PT Received On: 11/28/12 Assistance Needed: +1    Subjective Data      Cognition       Balance     End of Session PT - End of Session Equipment Utilized During Treatment: Gait belt;Left knee immobilizer Activity Tolerance: Patient limited by fatigue Patient left: in bed;with call bell/phone within reach   Felecia Shelling  PTA [facility]  Acute  Rehab Pager     [phone]

## 2012-11-29 NOTE — Progress Notes (Signed)
Clinical Social Work Department CLINICAL SOCIAL WORK PLACEMENT NOTE 11/29/2012  Patient:  Margaret Zhang, Margaret Zhang  Account Number:  [id number] Admit date:  11/23/2012  Clinical Social Worker:  Doroteo Glassman  Date/time:  11/26/2012 10:37 AM  Clinical Social Work is seeking post-discharge placement for this patient at the following level of care:   SKILLED NURSING   (*CSW will update this form in Epic as items are completed)   11/26/2012  Patient/family provided with [facility] Department of Clinical Social Work's list of facilities offering this level of care within the geographic area requested by the patient (or if unable, by the patient's family).  11/26/2012  Patient/family informed of their freedom to choose among providers that offer the needed level of care, that participate in Medicare, Medicaid or managed care program needed by the patient, have an available bed and are willing to accept the patient.  11/26/2012  Patient/family informed of [facility]' ownership interest in [facility], as well as of the fact that they are under no obligation to receive care at this facility.  PASARR submitted to EDS on 11/26/2012 PASARR number received from EDS on 11/26/2012  FL2 transmitted to all facilities in geographic area requested by pt/family on  11/26/2012 FL2 transmitted to all facilities within larger geographic area on   Patient informed that his/her managed care company has contracts with or will negotiate with  certain facilities, including the following:     Patient/family informed of bed offers received:  11/28/2012 Patient chooses bed at [facility] Physician recommends and patient chooses bed at    Patient to be transferred to [facility] on  11/28/2012 Patient to be transferred to facility by [facility]  The following physician request were entered in Epic:   Additional Comments:  Cori Razor LCSW [phone]

## 2013-07-22 IMAGING — CR DG CHEST 2V
2 series · 2 of 2 positions shown · non-contrast
Comparison: Chest x-ray of 03/05/2008

CLINICAL DATA: Preop for left knee surgery

CHEST - 2 VIEW

[w chest pa *]
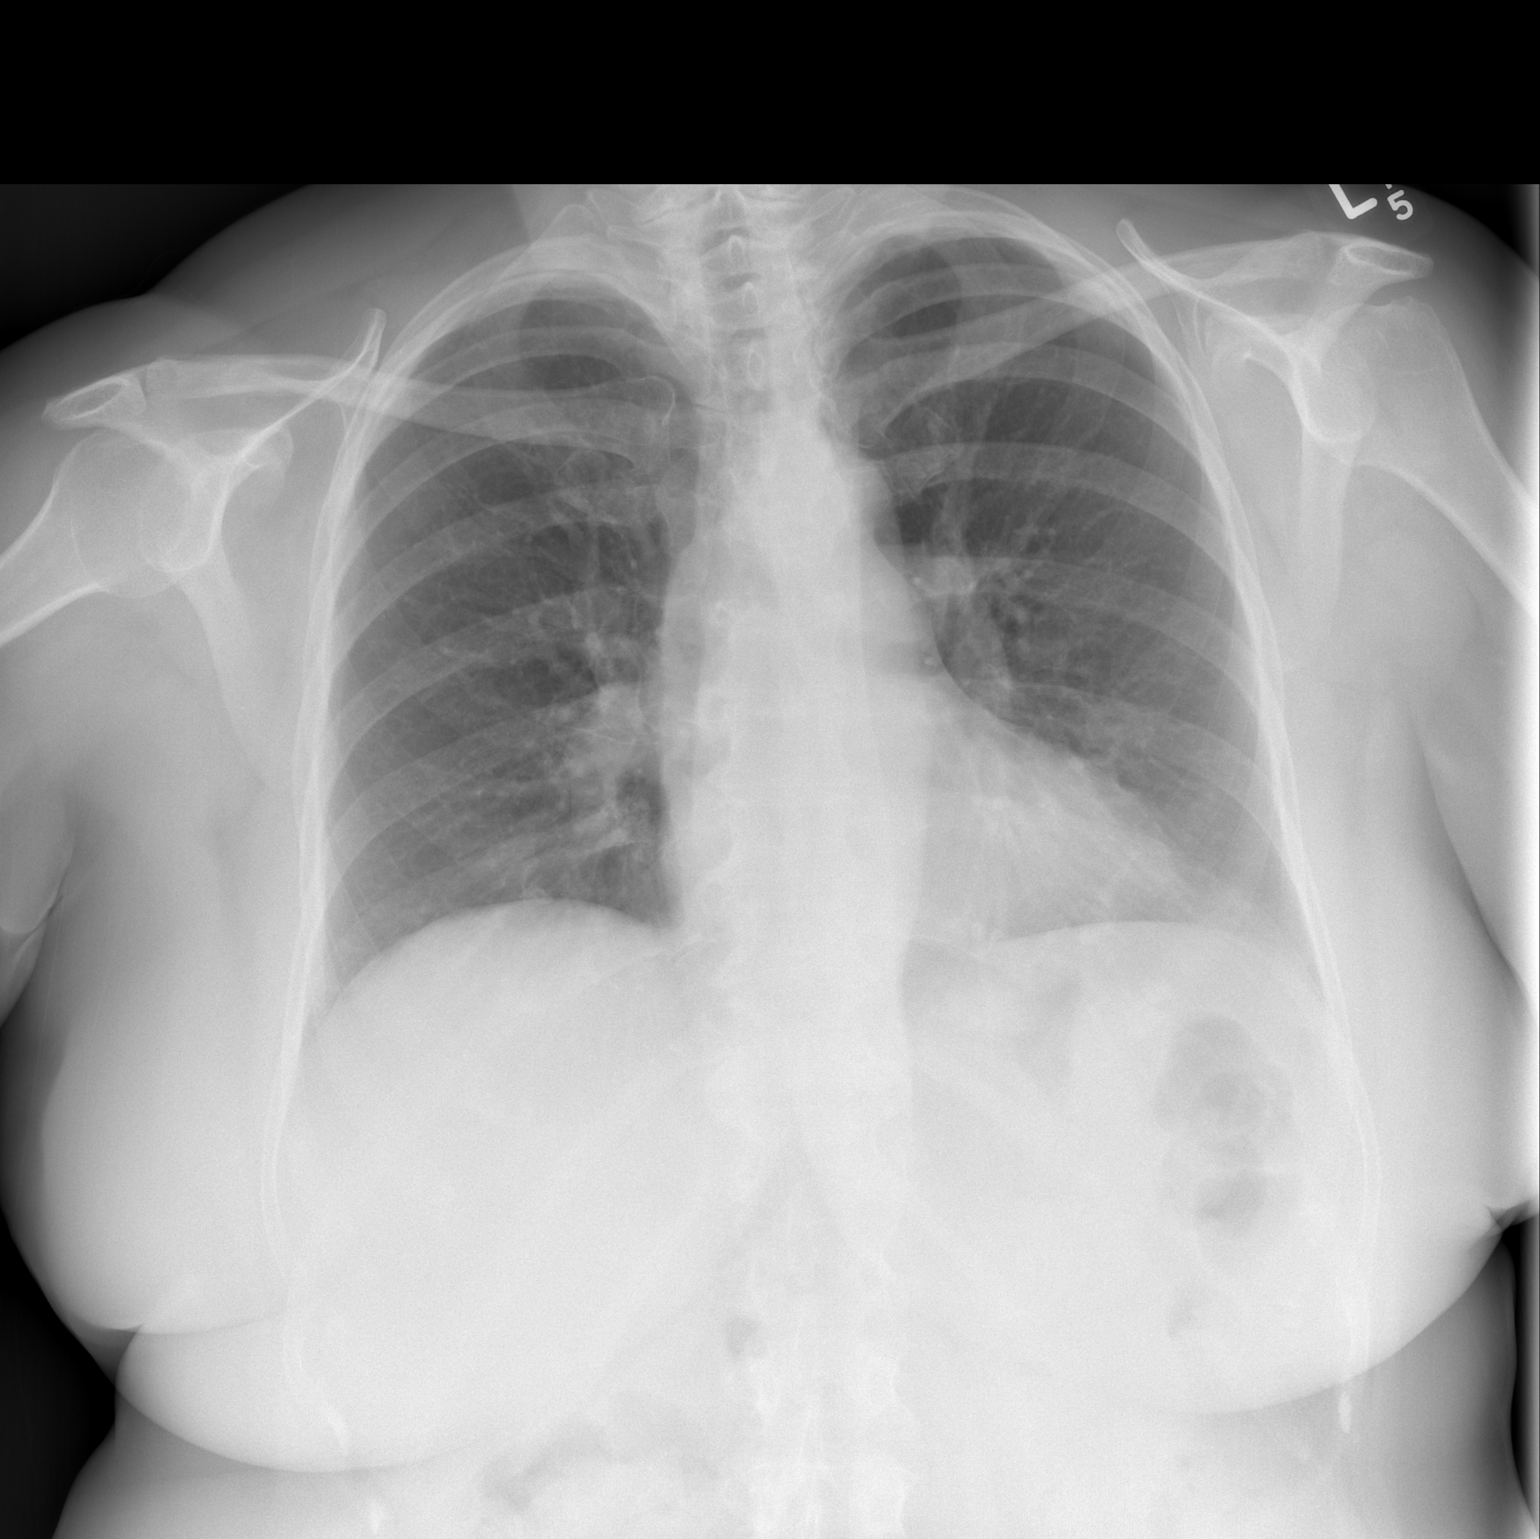

[w chest lat *]
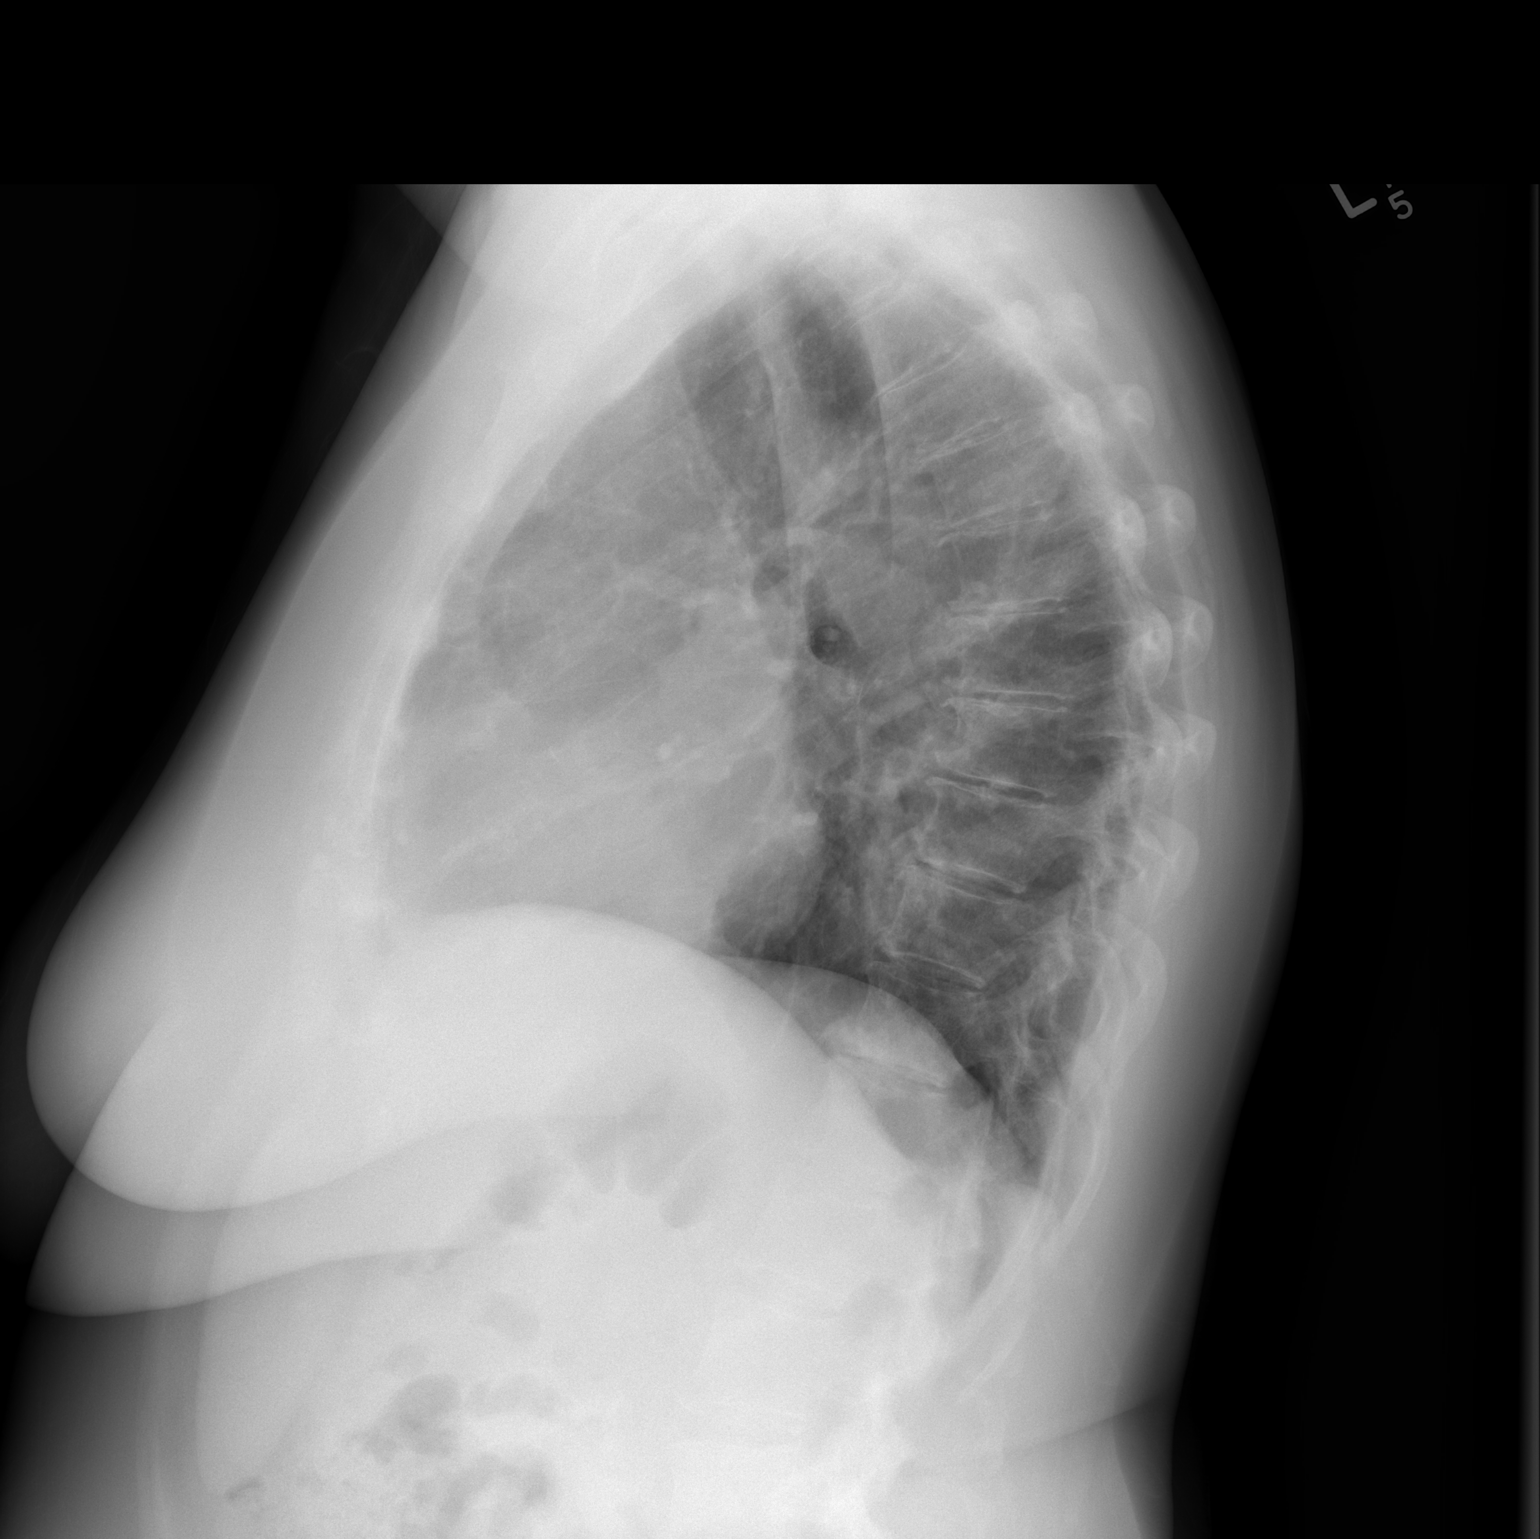

[2 of 2 positions shown; findings below may reference images not displayed]

FINDINGS: No active infiltrate or effusion is seen.  Mediastinal
contours are stable.  The heart is within upper limits of normal.
No bony abnormality is seen.
IMPRESSION: Stable chest x-ray.  No active lung disease.

## 2013-09-04 ENCOUNTER — Other Ambulatory Visit: Payer: Self-pay

## 2013-09-04 DIAGNOSIS — Z1231 Encounter for screening mammogram for malignant neoplasm of breast: Secondary | ICD-10-CM

## 2013-09-20 ENCOUNTER — Ambulatory Visit: Payer: Medicare Other

## 2013-09-28 ENCOUNTER — Inpatient Hospital Stay: Admission: RE | Admit: 2013-09-28 | Payer: Medicare Other | Source: Ambulatory Visit

## 2013-10-17 ENCOUNTER — Ambulatory Visit
Admission: RE | Admit: 2013-10-17 | Discharge: 2013-10-17 | Disposition: A | Discharge status: Home / Self Care | Payer: Medicare PPO | Source: Ambulatory Visit

## 2013-10-17 DIAGNOSIS — Z1231 Encounter for screening mammogram for malignant neoplasm of breast: Secondary | ICD-10-CM

## 2014-07-09 ENCOUNTER — Other Ambulatory Visit (HOSPITAL_COMMUNITY): Payer: Self-pay | Admitting: Family Medicine

## 2014-07-09 ENCOUNTER — Ambulatory Visit (HOSPITAL_COMMUNITY): Payer: Medicare PPO | Attending: Cardiology | Admitting: Cardiology

## 2014-07-09 DIAGNOSIS — I517 Cardiomegaly: Secondary | ICD-10-CM | POA: Insufficient documentation

## 2014-07-09 DIAGNOSIS — R011 Cardiac murmur, unspecified: Secondary | ICD-10-CM | POA: Insufficient documentation

## 2014-07-09 DIAGNOSIS — I059 Rheumatic mitral valve disease, unspecified: Secondary | ICD-10-CM | POA: Insufficient documentation

## 2014-07-09 DIAGNOSIS — I079 Rheumatic tricuspid valve disease, unspecified: Secondary | ICD-10-CM | POA: Insufficient documentation

## 2014-07-09 NOTE — Progress Notes (Signed)
Echo performed. 

## 2014-08-16 ENCOUNTER — Encounter: Payer: Self-pay | Admitting: Cardiovascular Disease

## 2014-08-16 ENCOUNTER — Ambulatory Visit (INDEPENDENT_AMBULATORY_CARE_PROVIDER_SITE_OTHER): Payer: Medicare PPO | Admitting: Cardiovascular Disease

## 2014-08-16 VITALS — BP 134/76 | HR 67 | Ht 64.0 in | Wt 202.0 lb

## 2014-08-16 DIAGNOSIS — R0600 Dyspnea, unspecified: Secondary | ICD-10-CM

## 2014-08-16 DIAGNOSIS — M1990 Unspecified osteoarthritis, unspecified site: Secondary | ICD-10-CM | POA: Insufficient documentation

## 2014-08-16 DIAGNOSIS — E669 Obesity, unspecified: Secondary | ICD-10-CM | POA: Insufficient documentation

## 2014-08-16 DIAGNOSIS — R9431 Abnormal electrocardiogram [ECG] [EKG]: Secondary | ICD-10-CM

## 2014-08-16 DIAGNOSIS — D649 Anemia, unspecified: Secondary | ICD-10-CM | POA: Insufficient documentation

## 2014-08-16 DIAGNOSIS — R011 Cardiac murmur, unspecified: Secondary | ICD-10-CM

## 2014-08-16 DIAGNOSIS — R0989 Other specified symptoms and signs involving the circulatory and respiratory systems: Secondary | ICD-10-CM

## 2014-08-16 DIAGNOSIS — R0609 Other forms of dyspnea: Secondary | ICD-10-CM

## 2014-08-16 DIAGNOSIS — E119 Type 2 diabetes mellitus without complications: Secondary | ICD-10-CM | POA: Insufficient documentation

## 2014-08-16 DIAGNOSIS — C801 Malignant (primary) neoplasm, unspecified: Secondary | ICD-10-CM | POA: Insufficient documentation

## 2014-08-16 DIAGNOSIS — I10 Essential (primary) hypertension: Secondary | ICD-10-CM | POA: Insufficient documentation

## 2014-08-16 DIAGNOSIS — F419 Anxiety disorder, unspecified: Secondary | ICD-10-CM | POA: Insufficient documentation

## 2014-08-16 NOTE — Assessment & Plan Note (Signed)
Well controlled.  Continue current medications and low sodium Dash type diet.    

## 2014-08-16 NOTE — Assessment & Plan Note (Signed)
Discussed low carb diet.  Target hemoglobin A1c is 6.5 or less.  Continue current medications. Has access to nutritionist through [facility]

## 2014-08-16 NOTE — Progress Notes (Signed)
Patient ID: Margaret Zhang, female   DOB: December 01, 1946, 68 y.o.   MRN: [id number]    68 yo long standing diabetic with HTN referred by primary for abnormla echo  She is overweight.  She gets exertinal dyspnea and tightness in her chest with activity.  She has never had a stress test Long standing history of murmur.  Echo reviewed and benign  EF normal 60% Grade one diastolic dysfunction more related to age, DM and HTN.  Trivial MR No hemodynamically significant valve disease.  She is compliant with meds.  She needs to see dietician to better understand low carb diet.  No smoking or lung disease to explain exertional dyspnea and tightness in chest.       ROS: Denies fever, malais, weight loss, blurry vision, decreased visual acuity, cough, sputum, SOB, hemoptysis, pleuritic pain, palpitaitons, heartburn, abdominal pain, melena, lower extremity edema, claudication, or rash.  All other systems reviewed and negative   General: Affect appropriate Healthy:  appears stated age 68: normal Neck supple with no adenopathy JVP normal no bruits no thyromegaly Lungs clear with no wheezing and good diaphragmatic motion Heart:  S1/S2 no murmur,rub, gallop or click PMI normal Abdomen: benighn, BS positve, no tenderness, no AAA no bruit.  No HSM or HJR Distal pulses intact with no bruits No edema Neuro non-focal Skin warm and dry No muscular weakness  Medications Current Outpatient Prescriptions  Medication Sig Dispense Refill  . amLODipine (NORVASC) 5 MG tablet Take 5 mg by mouth daily before breakfast.      . atorvastatin (LIPITOR) 80 MG tablet Take 80 mg by mouth at bedtime.      . fish oil-omega-3 fatty acids 1000 MG capsule Take 1 g by mouth 3 (three) times daily.      . Glucosamine-Chondroit-Vit C-Mn (GLUCOSAMINE 1500 COMPLEX PO) Take 1 tablet by mouth 3 (three) times daily.      Marland Kitchen lisinopril-hydrochlorothiazide (PRINZIDE,ZESTORETIC) 20-25 MG per tablet Take 1 tablet by mouth daily before  breakfast.      . metFORMIN (GLUCOPHAGE) 500 MG tablet Take 500-1,000 mg by mouth 2 (two) times daily with a meal. Takes 2 tablets in the morning and 1 at night      . methocarbamol (ROBAXIN) 500 MG tablet Take 1 tablet (500 mg total) by mouth every 6 (six) hours as needed.  40 tablet  2  . Multiple Vitamin (MULTIVITAMIN WITH MINERALS) TABS Take 1 tablet by mouth daily.      Marland Kitchen omeprazole (PRILOSEC OTC) 20 MG tablet Take 20 mg by mouth daily.      Marland Kitchen oxyCODONE-acetaminophen (PERCOCET/ROXICET) 5-325 MG per tablet Take 2 tablets by mouth every 4 (four) hours as needed (Q4-6 hours PRN).  50 tablet  0  . rivaroxaban (XARELTO) 10 MG TABS tablet Take 1 tablet (10 mg total) by mouth daily with breakfast.  18 tablet  0  . Vitamin D, Ergocalciferol, (DRISDOL) 50000 UNITS CAPS Take 50,000 Units by mouth every 7 (seven) days. wednesday       No current facility-administered medications for this visit.    Allergies Review of patient's allergies indicates no known allergies.  Family History: Family History  Problem Relation Age of Onset  . Lung cancer Father   . Hiatal hernia Mother   . Hypertension Mother   . Hypertension Brother     Social History: History   Social History  . Marital Status: Widowed    Spouse Name: N/A    Number of Children: N/A  .  Years of Education: N/A   Occupational History  . Not on file.   Social History Main Topics  . Smoking status: Never Smoker   . Smokeless tobacco: Not on file  . Alcohol Use: No  . Drug Use: No  . Sexual Activity:    Other Topics Concern  . Not on file   Social History Narrative  . No narrative on file    Electrocardiogram:  NSR rate 67 Low voltage nonspecific ST/T wave changes   Assessment and Plan

## 2014-08-16 NOTE — Assessment & Plan Note (Signed)
Aortic outflow murmur not related to trivial MR on echo No AS  F/u echo 3 years or if symptoms change

## 2014-08-16 NOTE — Patient Instructions (Signed)
The current medical regimen is effective;  continue present plan and medications.  Your physician has requested that you have a lexiscan myoview. For further information please visit [email or web address]. Please follow instruction sheet, as given.  Follow up in 1 year with Dr. Johnsie Cancel.  You will receive a letter in the mail 2 months before you are due.  Please call us when you receive this letter to schedule your follow up appointment.

## 2014-08-16 NOTE — Assessment & Plan Note (Signed)
Overweight diabetic with HTN grade one diastolic dysfunction with exertional dyspnea and "tightness"  ECG with nospecific ST changes  F/U lexiscan myovue to r/o anginal equivalent and CAD

## 2014-08-21 ENCOUNTER — Ambulatory Visit (HOSPITAL_COMMUNITY): Payer: Medicare PPO | Attending: Cardiology | Admitting: Radiology

## 2014-08-21 VITALS — BP 130/74 | Ht 62.0 in | Wt 199.0 lb

## 2014-08-21 DIAGNOSIS — E119 Type 2 diabetes mellitus without complications: Secondary | ICD-10-CM | POA: Insufficient documentation

## 2014-08-21 DIAGNOSIS — I10 Essential (primary) hypertension: Secondary | ICD-10-CM | POA: Diagnosis not present

## 2014-08-21 DIAGNOSIS — R0989 Other specified symptoms and signs involving the circulatory and respiratory systems: Secondary | ICD-10-CM | POA: Insufficient documentation

## 2014-08-21 DIAGNOSIS — R9431 Abnormal electrocardiogram [ECG] [EKG]: Secondary | ICD-10-CM | POA: Diagnosis not present

## 2014-08-21 DIAGNOSIS — R0609 Other forms of dyspnea: Secondary | ICD-10-CM | POA: Diagnosis not present

## 2014-08-21 DIAGNOSIS — R079 Chest pain, unspecified: Secondary | ICD-10-CM | POA: Insufficient documentation

## 2014-08-21 DIAGNOSIS — R0602 Shortness of breath: Secondary | ICD-10-CM

## 2014-08-21 MED ORDER — TECHNETIUM TC 99M SESTAMIBI GENERIC - CARDIOLITE
30.0000 | Freq: Once | INTRAVENOUS | Status: AC | PRN
  Administered 2014-08-21: 30 via INTRAVENOUS

## 2014-08-21 MED ORDER — TECHNETIUM TC 99M SESTAMIBI GENERIC - CARDIOLITE
10.0000 | Freq: Once | INTRAVENOUS | Status: AC | PRN
  Administered 2014-08-21: 10 via INTRAVENOUS

## 2014-08-21 NOTE — Progress Notes (Signed)
[name] [address] [place], [place] [address] [phone]    Cardiology Nuclear Med Study  Margaret Zhang is a 68 y.o. female     MRN : [id number]     DOB: March 30, 1946  Procedure Date: 08/21/2014  Nuclear Med Background Indication for Stress Test:  Evaluation for Ischemia and Abnormal EKG History:No Known History of CAD;Echo 07/09/14 EF:55-60% Cardiac Risk Factors: Hypertension and NIDDM  Symptoms:  Chest Pain and DOE   Nuclear Pre-Procedure Caffeine/Decaff Intake:  None> 12 hrs NPO After: 6:00pm   Lungs:  clear O2 Sat: 96% on room air. IV 0.9% NS with Angio Cath:  24g  IV Site: R Hand x 1, tolerated well IV Started by:  Irven Baltimore, RN  Chest Size (in):  36 Cup Size: D  Height: 5\' 2"  (1.575 m)  Weight:  199 lb (90.266 kg)  BMI:  Body mass index is 36.39 kg/(m^2). Tech Comments:  Patient took Norvasc, Lisinopril-HCTZ, but held Metformin this am. Irven Baltimore, RN.    Nuclear Med Study 1 or 2 day study: 1 day  Stress Test Type:  Stress  Reading MD: N/A  Order Authorizing Provider:  Jenkins Rouge, MD  Resting Radionuclide: Technetium 43m Sestamibi  Resting Radionuclide Dose: 11.0 mCi   Stress Radionuclide:  Technetium 35m Sestamibi  Stress Radionuclide Dose: 33.0 mCi           Stress Protocol Rest HR: 64 Stress HR: 134  Rest BP: 130/74 Stress BP: 204/71  Exercise Time (min): 8:21 METS: 7.70   Predicted Max HR: 151 bpm % Max HR: 88.74 bpm Rate Pressure Product: 27336   Dose of Adenosine (mg):  n/a Dose of Lexiscan: n/a mg  Dose of Atropine (mg): n/a Dose of Dobutamine: n/a mcg/kg/min (at max HR)  Stress Test Technologist: Ileene Hutchinson, EMT-P  Nuclear Technologist:  Annye Rusk, CNMT     Rest Procedure:  Myocardial perfusion imaging was performed at rest 45 minutes following the intravenous administration of Technetium 72m Sestamibi. Rest ECG: NSR - Normal EKG  Stress Procedure:  The patient exercised on the treadmill utilizing  the Bruce Protocol for 8:21 minutes. The patient stopped due to sob and denied any chest pain.  Technetium 9m Sestamibi was injected at peak exercise and myocardial perfusion imaging was performed after a brief delay. Stress ECG: No significant change from baseline ECG  QPS Raw Data Images:  Normal; no motion artifact; normal heart/lung ratio. Stress Images:  Normal homogeneous uptake in all areas of the myocardium. Rest Images:  Normal homogeneous uptake in all areas of the myocardium. Subtraction (SDS):  No evidence of ischemia. Transient Ischemic Dilatation (Normal <1.22):  0.90 Lung/Heart Ratio (Normal <0.45):  0.34  Quantitative Gated Spect Images QGS EDV:  80 ml QGS ESV:  22 ml  Impression Exercise Capacity:  Fair exercise capacity. BP Response:  Hypertensive blood pressure response. Clinical Symptoms:  There is dyspnea. ECG Impression:  No significant ST segment change suggestive of ischemia. Comparison with Prior Nuclear Study: No images to compare  Overall Impression:  Normal stress nuclear study.  LV Ejection Fraction: 73%.  LV Wall Motion:  NL LV Function; NL Wall Motion   Jenkins Rouge

## 2014-08-30 ENCOUNTER — Telehealth: Payer: Self-pay | Admitting: *Deleted

## 2014-08-30 NOTE — Telephone Encounter (Signed)
PT AWARE OF MYOVIEW RESULTS./CY 

## 2014-09-24 ENCOUNTER — Other Ambulatory Visit: Payer: Self-pay

## 2014-09-24 DIAGNOSIS — Z1231 Encounter for screening mammogram for malignant neoplasm of breast: Secondary | ICD-10-CM

## 2014-10-22 ENCOUNTER — Ambulatory Visit
Admission: RE | Admit: 2014-10-22 | Discharge: 2014-10-22 | Disposition: A | Discharge status: Home / Self Care | Payer: Medicare PPO | Source: Ambulatory Visit

## 2014-10-22 DIAGNOSIS — Z1231 Encounter for screening mammogram for malignant neoplasm of breast: Secondary | ICD-10-CM

## 2014-12-31 DIAGNOSIS — K219 Gastro-esophageal reflux disease without esophagitis: Secondary | ICD-10-CM | POA: Diagnosis not present

## 2014-12-31 DIAGNOSIS — I10 Essential (primary) hypertension: Secondary | ICD-10-CM | POA: Diagnosis not present

## 2014-12-31 DIAGNOSIS — E1165 Type 2 diabetes mellitus with hyperglycemia: Secondary | ICD-10-CM | POA: Diagnosis not present

## 2014-12-31 DIAGNOSIS — E559 Vitamin D deficiency, unspecified: Secondary | ICD-10-CM | POA: Diagnosis not present

## 2014-12-31 DIAGNOSIS — E780 Pure hypercholesterolemia: Secondary | ICD-10-CM | POA: Diagnosis not present

## 2015-06-03 DIAGNOSIS — M5137 Other intervertebral disc degeneration, lumbosacral region: Secondary | ICD-10-CM | POA: Diagnosis not present

## 2015-06-03 DIAGNOSIS — M9905 Segmental and somatic dysfunction of pelvic region: Secondary | ICD-10-CM | POA: Diagnosis not present

## 2015-06-05 DIAGNOSIS — M5137 Other intervertebral disc degeneration, lumbosacral region: Secondary | ICD-10-CM | POA: Diagnosis not present

## 2015-06-05 DIAGNOSIS — M9905 Segmental and somatic dysfunction of pelvic region: Secondary | ICD-10-CM | POA: Diagnosis not present

## 2015-06-06 DIAGNOSIS — M5137 Other intervertebral disc degeneration, lumbosacral region: Secondary | ICD-10-CM | POA: Diagnosis not present

## 2015-06-06 DIAGNOSIS — M9905 Segmental and somatic dysfunction of pelvic region: Secondary | ICD-10-CM | POA: Diagnosis not present

## 2015-06-11 DIAGNOSIS — M5137 Other intervertebral disc degeneration, lumbosacral region: Secondary | ICD-10-CM | POA: Diagnosis not present

## 2015-06-11 DIAGNOSIS — M9905 Segmental and somatic dysfunction of pelvic region: Secondary | ICD-10-CM | POA: Diagnosis not present

## 2015-06-12 DIAGNOSIS — M9905 Segmental and somatic dysfunction of pelvic region: Secondary | ICD-10-CM | POA: Diagnosis not present

## 2015-06-12 DIAGNOSIS — M5137 Other intervertebral disc degeneration, lumbosacral region: Secondary | ICD-10-CM | POA: Diagnosis not present

## 2015-06-13 DIAGNOSIS — M9905 Segmental and somatic dysfunction of pelvic region: Secondary | ICD-10-CM | POA: Diagnosis not present

## 2015-06-13 DIAGNOSIS — M5137 Other intervertebral disc degeneration, lumbosacral region: Secondary | ICD-10-CM | POA: Diagnosis not present

## 2015-06-17 DIAGNOSIS — M5137 Other intervertebral disc degeneration, lumbosacral region: Secondary | ICD-10-CM | POA: Diagnosis not present

## 2015-06-17 DIAGNOSIS — M9905 Segmental and somatic dysfunction of pelvic region: Secondary | ICD-10-CM | POA: Diagnosis not present

## 2015-06-19 DIAGNOSIS — M9905 Segmental and somatic dysfunction of pelvic region: Secondary | ICD-10-CM | POA: Diagnosis not present

## 2015-06-19 DIAGNOSIS — M5137 Other intervertebral disc degeneration, lumbosacral region: Secondary | ICD-10-CM | POA: Diagnosis not present

## 2015-06-20 DIAGNOSIS — M5137 Other intervertebral disc degeneration, lumbosacral region: Secondary | ICD-10-CM | POA: Diagnosis not present

## 2015-06-20 DIAGNOSIS — M9905 Segmental and somatic dysfunction of pelvic region: Secondary | ICD-10-CM | POA: Diagnosis not present

## 2015-06-24 DIAGNOSIS — M9905 Segmental and somatic dysfunction of pelvic region: Secondary | ICD-10-CM | POA: Diagnosis not present

## 2015-06-24 DIAGNOSIS — M5137 Other intervertebral disc degeneration, lumbosacral region: Secondary | ICD-10-CM | POA: Diagnosis not present

## 2015-06-28 DIAGNOSIS — R5383 Other fatigue: Secondary | ICD-10-CM | POA: Diagnosis not present

## 2015-06-28 DIAGNOSIS — E780 Pure hypercholesterolemia: Secondary | ICD-10-CM | POA: Diagnosis not present

## 2015-06-28 DIAGNOSIS — E1165 Type 2 diabetes mellitus with hyperglycemia: Secondary | ICD-10-CM | POA: Diagnosis not present

## 2015-06-28 DIAGNOSIS — D500 Iron deficiency anemia secondary to blood loss (chronic): Secondary | ICD-10-CM | POA: Diagnosis not present

## 2015-06-28 DIAGNOSIS — K219 Gastro-esophageal reflux disease without esophagitis: Secondary | ICD-10-CM | POA: Diagnosis not present

## 2015-06-28 DIAGNOSIS — N951 Menopausal and female climacteric states: Secondary | ICD-10-CM | POA: Diagnosis not present

## 2015-06-28 DIAGNOSIS — I10 Essential (primary) hypertension: Secondary | ICD-10-CM | POA: Diagnosis not present

## 2015-06-28 DIAGNOSIS — E559 Vitamin D deficiency, unspecified: Secondary | ICD-10-CM | POA: Diagnosis not present

## 2015-07-31 DIAGNOSIS — N951 Menopausal and female climacteric states: Secondary | ICD-10-CM | POA: Diagnosis not present

## 2015-09-15 DIAGNOSIS — J209 Acute bronchitis, unspecified: Secondary | ICD-10-CM | POA: Diagnosis not present

## 2015-09-23 ENCOUNTER — Other Ambulatory Visit: Payer: Self-pay

## 2015-09-23 DIAGNOSIS — Z1231 Encounter for screening mammogram for malignant neoplasm of breast: Secondary | ICD-10-CM

## 2015-10-02 DIAGNOSIS — E1165 Type 2 diabetes mellitus with hyperglycemia: Secondary | ICD-10-CM | POA: Diagnosis not present

## 2015-10-02 DIAGNOSIS — N951 Menopausal and female climacteric states: Secondary | ICD-10-CM | POA: Diagnosis not present

## 2015-10-02 DIAGNOSIS — Z23 Encounter for immunization: Secondary | ICD-10-CM | POA: Diagnosis not present

## 2015-10-02 DIAGNOSIS — D500 Iron deficiency anemia secondary to blood loss (chronic): Secondary | ICD-10-CM | POA: Diagnosis not present

## 2015-10-02 DIAGNOSIS — I10 Essential (primary) hypertension: Secondary | ICD-10-CM | POA: Diagnosis not present

## 2015-10-02 DIAGNOSIS — E7800 Pure hypercholesterolemia, unspecified: Secondary | ICD-10-CM | POA: Diagnosis not present

## 2015-10-02 DIAGNOSIS — E559 Vitamin D deficiency, unspecified: Secondary | ICD-10-CM | POA: Diagnosis not present

## 2015-10-02 DIAGNOSIS — Z0000 Encounter for general adult medical examination without abnormal findings: Secondary | ICD-10-CM | POA: Diagnosis not present

## 2015-10-02 DIAGNOSIS — H9190 Unspecified hearing loss, unspecified ear: Secondary | ICD-10-CM | POA: Diagnosis not present

## 2015-10-21 ENCOUNTER — Other Ambulatory Visit: Payer: Self-pay | Admitting: Family Medicine

## 2015-10-21 DIAGNOSIS — R5381 Other malaise: Secondary | ICD-10-CM

## 2015-10-24 ENCOUNTER — Ambulatory Visit
Admission: RE | Admit: 2015-10-24 | Discharge: 2015-10-24 | Disposition: A | Discharge status: Home / Self Care | Payer: Medicare PPO | Source: Ambulatory Visit

## 2015-10-24 DIAGNOSIS — Z1231 Encounter for screening mammogram for malignant neoplasm of breast: Secondary | ICD-10-CM

## 2015-10-30 DIAGNOSIS — E119 Type 2 diabetes mellitus without complications: Secondary | ICD-10-CM | POA: Diagnosis not present

## 2015-10-30 DIAGNOSIS — H43813 Vitreous degeneration, bilateral: Secondary | ICD-10-CM | POA: Diagnosis not present

## 2015-10-30 DIAGNOSIS — H2513 Age-related nuclear cataract, bilateral: Secondary | ICD-10-CM | POA: Diagnosis not present

## 2015-11-01 ENCOUNTER — Other Ambulatory Visit: Payer: Self-pay | Admitting: Family Medicine

## 2015-11-02 ENCOUNTER — Other Ambulatory Visit: Payer: Self-pay | Admitting: Family Medicine

## 2015-11-02 DIAGNOSIS — E2839 Other primary ovarian failure: Secondary | ICD-10-CM

## 2015-11-16 DIAGNOSIS — M25561 Pain in right knee: Secondary | ICD-10-CM | POA: Diagnosis not present

## 2015-12-04 ENCOUNTER — Other Ambulatory Visit: Payer: Self-pay

## 2015-12-17 ENCOUNTER — Ambulatory Visit: Payer: Self-pay | Admitting: Cardiovascular Disease

## 2016-01-02 ENCOUNTER — Ambulatory Visit
Admission: RE | Admit: 2016-01-02 | Discharge: 2016-01-02 | Disposition: A | Discharge status: Home / Self Care | Payer: 59 | Source: Ambulatory Visit | Attending: Family Medicine | Admitting: Family Medicine

## 2016-01-02 DIAGNOSIS — E2839 Other primary ovarian failure: Secondary | ICD-10-CM

## 2016-01-09 ENCOUNTER — Encounter: Payer: Self-pay | Admitting: *Deleted

## 2016-01-09 NOTE — Progress Notes (Signed)
Patient ID: Margaret Zhang, female   DOB: 07-08-1946, 70 y.o.   MRN: [id number]    70 y.o.  long standing diabetic with HTN referred by primary for abnormla echo  She is overweight.  She gets exertinal dyspnea and tightness in her chest with activity.  She has never had a stress test Long standing history of murmur.  Echo reviewed and benign  EF normal 60% Grade one diastolic dysfunction more related to age, DM and HTN.  Trivial MR No hemodynamically significant valve disease.  She is compliant with meds.  She needs to see dietician to better understand low carb diet.  No smoking or lung disease to explain exertional dyspnea and tightness in chest.     F/U Myovue Normal 07/2014   ROS: Denies fever, malais, weight loss, blurry vision, decreased visual acuity, cough, sputum, SOB, hemoptysis, pleuritic pain, palpitaitons, heartburn, abdominal pain, melena, lower extremity edema, claudication, or rash.  All other systems reviewed and negative   General: Affect appropriate Healthy:  appears stated age 70: normal Neck supple with no adenopathy JVP normal no bruits no thyromegaly Lungs clear with no wheezing and good diaphragmatic motion Heart:  S1/S2 no murmur,rub, gallop or click PMI normal Abdomen: benighn, BS positve, no tenderness, no AAA no bruit.  No HSM or HJR Distal pulses intact with no bruits No edema Neuro non-focal Skin warm and dry No muscular weakness  Medications Current Outpatient Prescriptions  Medication Sig Dispense Refill  . amLODipine (NORVASC) 5 MG tablet Take 5 mg by mouth daily before breakfast.    . atorvastatin (LIPITOR) 80 MG tablet Take 80 mg by mouth at bedtime.    . fish oil-omega-3 fatty acids 1000 MG capsule Take 1 g by mouth 3 (three) times daily.    . Glucosamine-Chondroit-Vit C-Mn (GLUCOSAMINE 1500 COMPLEX PO) Take 1 tablet by mouth 3 (three) times daily.    Marland Kitchen lisinopril-hydrochlorothiazide (PRINZIDE,ZESTORETIC) 20-25 MG per tablet Take 1 tablet by mouth  daily before breakfast.    . metFORMIN (GLUCOPHAGE) 500 MG tablet Take 500-1,000 mg by mouth 2 (two) times daily with a meal.     . methocarbamol (ROBAXIN) 500 MG tablet Take 500 mg by mouth every 6 (six) hours as needed for muscle spasms.    . Multiple Vitamin (MULTIVITAMIN WITH MINERALS) TABS Take 1 tablet by mouth daily.    Marland Kitchen omeprazole (PRILOSEC OTC) 20 MG tablet Take 20 mg by mouth daily.    Marland Kitchen oxyCODONE-acetaminophen (PERCOCET/ROXICET) 5-325 MG tablet Take 1-2 tablets by mouth every 4 (four) hours as needed for moderate pain or severe pain.    . rivaroxaban (XARELTO) 10 MG TABS tablet Take 1 tablet (10 mg total) by mouth daily with breakfast. 18 tablet 0  . Vitamin D, Ergocalciferol, (DRISDOL) 50000 UNITS CAPS Take 50,000 Units by mouth every 7 (seven) days. wednesday     No current facility-administered medications for this visit.    Allergies Review of patient's allergies indicates no known allergies.  Family History: Family History  Problem Relation Age of Onset  . Lung cancer Father   . Hiatal hernia Mother   . Hypertension Mother   . Hypertension Brother     Social History: Social History   Social History  . Marital Status: Widowed    Spouse Name: N/A  . Number of Children: N/A  . Years of Education: N/A   Occupational History  . Not on file.   Social History Main Topics  . Smoking status: Never Smoker   .  Smokeless tobacco: Not on file  . Alcohol Use: No  . Drug Use: No  . Sexual Activity: Not on file   Other Topics Concern  . Not on file   Social History Narrative    Electrocardiogram:  08/16/14  NSR rate 67 Low voltage nonspecific ST/T wave changes   Assessment and Plan Chest Pain: resolved normal myovue 07/2014 DM:  Discussed low carb diet.  Target hemoglobin A1c is 6.5 or less.  Continue current medications. HTN: Well controlled.  Continue current medications and low sodium Dash type diet.   Chol:  No results found for: [facility]   F/u with me in a  year   Jenkins Rouge

## 2016-01-10 ENCOUNTER — Encounter: Payer: Medicare PPO | Admitting: Cardiovascular Disease

## 2016-01-13 ENCOUNTER — Encounter: Payer: Self-pay | Admitting: Cardiovascular Disease

## 2017-03-17 ENCOUNTER — Other Ambulatory Visit: Payer: Self-pay | Admitting: Family Medicine

## 2017-03-17 DIAGNOSIS — Z1231 Encounter for screening mammogram for malignant neoplasm of breast: Secondary | ICD-10-CM

## 2017-03-22 ENCOUNTER — Ambulatory Visit
Admission: RE | Admit: 2017-03-22 | Discharge: 2017-03-22 | Disposition: A | Discharge status: Home / Self Care | Payer: 59 | Source: Ambulatory Visit | Attending: Family Medicine | Admitting: Family Medicine

## 2017-03-22 DIAGNOSIS — Z1231 Encounter for screening mammogram for malignant neoplasm of breast: Secondary | ICD-10-CM

## 2017-04-21 ENCOUNTER — Telehealth: Payer: Self-pay | Admitting: *Deleted

## 2017-04-21 NOTE — Telephone Encounter (Signed)
Referral notes from Darcus Austin, MD of [facility] sent to scheduling.

## 2017-04-22 ENCOUNTER — Other Ambulatory Visit: Payer: Self-pay | Admitting: Family Medicine

## 2017-04-22 DIAGNOSIS — R011 Cardiac murmur, unspecified: Secondary | ICD-10-CM

## 2017-05-04 ENCOUNTER — Other Ambulatory Visit: Payer: Self-pay

## 2017-05-04 ENCOUNTER — Ambulatory Visit (HOSPITAL_COMMUNITY): Payer: Medicare Other | Attending: Cardiovascular Disease

## 2017-05-04 DIAGNOSIS — E119 Type 2 diabetes mellitus without complications: Secondary | ICD-10-CM | POA: Diagnosis not present

## 2017-05-04 DIAGNOSIS — I351 Nonrheumatic aortic (valve) insufficiency: Secondary | ICD-10-CM | POA: Insufficient documentation

## 2017-05-04 DIAGNOSIS — I10 Essential (primary) hypertension: Secondary | ICD-10-CM | POA: Insufficient documentation

## 2017-05-04 DIAGNOSIS — Z6836 Body mass index (BMI) 36.0-36.9, adult: Secondary | ICD-10-CM | POA: Diagnosis not present

## 2017-05-04 DIAGNOSIS — I340 Nonrheumatic mitral (valve) insufficiency: Secondary | ICD-10-CM | POA: Diagnosis not present

## 2017-05-04 DIAGNOSIS — E785 Hyperlipidemia, unspecified: Secondary | ICD-10-CM | POA: Diagnosis not present

## 2017-05-04 DIAGNOSIS — E669 Obesity, unspecified: Secondary | ICD-10-CM | POA: Insufficient documentation

## 2017-05-04 DIAGNOSIS — R011 Cardiac murmur, unspecified: Secondary | ICD-10-CM | POA: Insufficient documentation

## 2017-05-06 ENCOUNTER — Encounter: Payer: Self-pay | Admitting: Cardiology

## 2017-05-09 NOTE — Progress Notes (Signed)
Cardiology Office Note   Date:  05/10/2017   ID:  Margaret Zhang, Margaret Zhang [birth year], MRN [id number]  PCP:  Darcus Austin, MD  Cardiologist:  Dr. Johnsie Cancel last seen 2015    Chief Complaint  Patient presents with  . Heart Murmur      History of Present Illness: Margaret Zhang is a 71 y.o. female who presents for murmur.   She has a hx of diabetes and HTN.  She was seen in 2015 for abnormal Echo and with dyspnea and chest tightness.   The prevouis echo with EF 60% and G1DD.  Trivial MR.   Exercise myoview with EF 73%, LV wall motion, nl LV function, NL wall motion.     Now with echo of EF 55-60%, normal WM, G1DD. Trivial AR, LA was moderately dilated.  Trivial MR. Similar to previous Echo.  Today she has no complaints.  No chest pain and no SOB.  Her DM is mildly elevated with HgbA1c.  She is working on that and HLD followed by PCP.   She does exercise by walking on TM and yard work.  BP today is stable.  She is obese and is exercising and monitoring food.   Past Medical History:  Diagnosis Date  . Anemia   . Anxiety   . Anxiety   . Cancer ([place])    basal cell of the face  . Colon polyp   . Diabetes mellitus without complication ([place])    type 2  . GERD (gastroesophageal reflux disease)   . Hyperlipidemia   . Hypertension   . Obesity   . Obesity   . Osteoarthritis    hands -.Dr Margaret Zhang  . Osteoarthrosis, unspecified whether generalized or localized, hand   . Vitamin D deficiency     Past Surgical History:  Procedure Laterality Date  .  breast biopsy     benign cyst - 1981  . ABDOMINAL HYSTERECTOMY    . APPENDECTOMY    . BREAST SURGERY    . KNEE SURGERY  2011   left -arthroscopy  . TOTAL ABDOMINAL HYSTERECTOMY  1977   unilateral due to endometriosis  . TOTAL KNEE ARTHROPLASTY  11/23/2012   Procedure: TOTAL KNEE ARTHROPLASTY;  Surgeon: Tobi Bastos, MD;  Location: [facility];  Service: Orthopedics;  Laterality: Left;     Current Outpatient Prescriptions  Medication Sig  Dispense Refill  . amLODipine (NORVASC) 5 MG tablet Take 5 mg by mouth daily before breakfast.    . atorvastatin (LIPITOR) 80 MG tablet Take 80 mg by mouth at bedtime.    . fish oil-omega-3 fatty acids 1000 MG capsule Take 1 g by mouth 3 (three) times daily.    Marland Kitchen glipiZIDE (GLUCOTROL XL) 10 MG 24 hr tablet Take 10 mg by mouth daily with breakfast.    . Glucosamine-Chondroit-Vit C-Mn (GLUCOSAMINE 1500 COMPLEX PO) Take 1 tablet by mouth 3 (three) times daily.    Marland Kitchen lisinopril-hydrochlorothiazide (PRINZIDE,ZESTORETIC) 20-25 MG per tablet Take 1 tablet by mouth daily before breakfast.    . metFORMIN (GLUCOPHAGE) 500 MG tablet Take 500-1,000 mg by mouth 2 (two) times daily with a meal.     . Multiple Vitamin (MULTIVITAMIN WITH MINERALS) TABS Take 1 tablet by mouth daily.    Marland Kitchen omeprazole (PRILOSEC OTC) 20 MG tablet Take 20 mg by mouth daily.    Marland Kitchen venlafaxine XR (EFFEXOR-XR) 37.5 MG 24 hr capsule TAKE ONE CAPSULE BY MOUTH WITH FOOD EVERY DAY  1  . Vitamin D, Ergocalciferol, (DRISDOL)  50000 UNITS CAPS Take 50,000 Units by mouth every 7 (seven) days. wednesday     No current facility-administered medications for this visit.     Allergies:   Morphine and related    Social History:  The patient  reports that she has never smoked. She has never used smokeless tobacco. She reports that she does not drink alcohol or use drugs.   Family History:  The patient's family history includes Dementia in her mother; Diabetes in her brother; Healthy in her brother; Hiatal hernia in her mother; Hypertension in her brother and mother; Lung cancer in her father. mother just placed in assisted living is 73 yrs old.      ROS:  General:no colds or fevers, no weight changes Skin:no rashes or ulcers HEENT:no blurred vision, no congestion CV:see HPI PUL:see HPI GI:no diarrhea constipation or melena, no indigestion GU:no hematuria, no dysuria MS:no joint pain, no claudication Neuro:no syncope, no lightheadedness Endo:+  diabetes, no thyroid disease  Wt Readings from Last 3 Encounters:  05/10/17 208 lb (94.3 kg)  08/21/14 199 lb (90.3 kg)  08/16/14 202 lb (91.6 kg)     PHYSICAL EXAM: VS:  BP 122/64   Pulse 69   Ht 5\' 2"  (1.575 m)   Wt 208 lb (94.3 kg)   BMI 38.04 kg/m  , BMI Body mass index is 38.04 kg/m. General:Pleasant affect, NAD Skin:Warm and dry, brisk capillary refill HEENT:normocephalic, sclera clear, mucus membranes moist Neck:supple, no JVD, no bruits, no lymphadenopathy   Heart:S1S2 RRR with 1-2 systolic murmur, no gallup, rub or click Lungs:clear without rales, rhonchi, or wheezes ZES:PQZR, non tender, + BS, do not palpate liver spleen or masses Ext:no lower ext edema, 2+ pedal pulses, 2+ radial pulses Neuro:alert and oriented X 3, MAE, follows commands, + facial symmetry    EKG:  EKG is ordered today. The ekg ordered today demonstrates SR non specific ST abnormality but no changes from 2015.    Recent Labs: No results found for requested labs within last 8760 hours.    Lipid Panel No results found for: CHOL, TRIG, HDL, CHOLHDL, VLDL, LDLCALC, LDLDIRECT     Other studies Reviewed: Additional studies/ records that were reviewed today include:  ECHO. Study Conclusions  - Left ventricle: The cavity size was normal. There was mild   concentric hypertrophy. Systolic function was normal. The   estimated ejection fraction was in the range of 55% to 60%. Wall   motion was normal; there were no regional wall motion   abnormalities. Doppler parameters are consistent with abnormal   left ventricular relaxation (grade 1 diastolic dysfunction). - Aortic valve: There was trivial regurgitation. - Left atrium: The atrium was moderately dilated. - Atrial septum: No defect or patent foramen ovale was identified.   ASSESSMENT AND PLAN:  1. Murmur echo with normal EF, trivial AI and trivial MR she does continue with G1DD.  Stable.  Discussed to call if swelling of SOB otherwise  Dr. Inda Merlin will refer every 2 years for follow up.    2.  HTN controlled per PCP  3. HLD on lipitor. Per PCP   4.  DM followed by PCP last HgBA1C 8.0 pt aware to monitor diet and continue exercise.     Current medicines are reviewed with the patient today.  The patient Has no concerns regarding medicines.  The following changes have been made:  See above Labs/ tests ordered today include:see above  Disposition:   FU:  see above  Signed, Cecilie Kicks, NP  05/10/2017 8:34 AM    [place], [address] [place], [place] Phone: [phone]; Fax: [phone]

## 2017-05-10 ENCOUNTER — Ambulatory Visit (INDEPENDENT_AMBULATORY_CARE_PROVIDER_SITE_OTHER): Payer: Medicare Other | Admitting: Cardiology

## 2017-05-10 ENCOUNTER — Encounter: Payer: Self-pay | Admitting: Cardiology

## 2017-05-10 VITALS — BP 122/64 | HR 69 | Ht 62.0 in | Wt 208.0 lb

## 2017-05-10 DIAGNOSIS — I10 Essential (primary) hypertension: Secondary | ICD-10-CM | POA: Diagnosis not present

## 2017-05-10 DIAGNOSIS — R011 Cardiac murmur, unspecified: Secondary | ICD-10-CM

## 2017-05-10 DIAGNOSIS — F419 Anxiety disorder, unspecified: Secondary | ICD-10-CM | POA: Diagnosis not present

## 2017-05-10 DIAGNOSIS — E782 Mixed hyperlipidemia: Secondary | ICD-10-CM

## 2017-05-10 NOTE — Patient Instructions (Signed)
Medication Instructions:  Your physician recommends that you continue on your current medications as directed. Please refer to the Current Medication list given to you today.   Labwork: -None  Testing/Procedures: -None  Follow-Up: As needed with Dr. Johnsie Cancel  Any Other Special Instructions Will Be Listed Below (If Applicable).     If you need a refill on your cardiac medications before your next appointment, please call your pharmacy.

## 2018-02-18 ENCOUNTER — Other Ambulatory Visit: Payer: Self-pay | Admitting: Family Medicine

## 2018-02-18 DIAGNOSIS — Z1231 Encounter for screening mammogram for malignant neoplasm of breast: Secondary | ICD-10-CM

## 2018-03-23 ENCOUNTER — Ambulatory Visit
Admission: RE | Admit: 2018-03-23 | Discharge: 2018-03-23 | Disposition: A | Discharge status: Home / Self Care | Payer: Medicare Other | Source: Ambulatory Visit | Attending: Family Medicine | Admitting: Family Medicine

## 2018-03-23 DIAGNOSIS — Z1231 Encounter for screening mammogram for malignant neoplasm of breast: Secondary | ICD-10-CM

## 2018-12-10 ENCOUNTER — Emergency Department (HOSPITAL_COMMUNITY): Payer: Medicare Other

## 2018-12-10 ENCOUNTER — Encounter (HOSPITAL_COMMUNITY): Payer: Self-pay | Admitting: Emergency Medicine

## 2018-12-10 ENCOUNTER — Other Ambulatory Visit: Payer: Self-pay

## 2018-12-10 ENCOUNTER — Observation Stay (HOSPITAL_COMMUNITY)
Admission: EM | Admit: 2018-12-10 | Discharge: 2018-12-11 | Disposition: A | Discharge status: Home / Self Care | Payer: Medicare Other | Attending: Internal Medicine | Admitting: Internal Medicine

## 2018-12-10 DIAGNOSIS — I10 Essential (primary) hypertension: Secondary | ICD-10-CM | POA: Diagnosis not present

## 2018-12-10 DIAGNOSIS — K219 Gastro-esophageal reflux disease without esophagitis: Secondary | ICD-10-CM | POA: Insufficient documentation

## 2018-12-10 DIAGNOSIS — E785 Hyperlipidemia, unspecified: Secondary | ICD-10-CM | POA: Diagnosis not present

## 2018-12-10 DIAGNOSIS — E119 Type 2 diabetes mellitus without complications: Secondary | ICD-10-CM

## 2018-12-10 DIAGNOSIS — E669 Obesity, unspecified: Secondary | ICD-10-CM | POA: Insufficient documentation

## 2018-12-10 DIAGNOSIS — E559 Vitamin D deficiency, unspecified: Secondary | ICD-10-CM | POA: Diagnosis not present

## 2018-12-10 DIAGNOSIS — R55 Syncope and collapse: Secondary | ICD-10-CM | POA: Insufficient documentation

## 2018-12-10 DIAGNOSIS — D509 Iron deficiency anemia, unspecified: Secondary | ICD-10-CM | POA: Insufficient documentation

## 2018-12-10 DIAGNOSIS — J101 Influenza due to other identified influenza virus with other respiratory manifestations: Principal | ICD-10-CM | POA: Insufficient documentation

## 2018-12-10 DIAGNOSIS — F419 Anxiety disorder, unspecified: Secondary | ICD-10-CM | POA: Diagnosis not present

## 2018-12-10 DIAGNOSIS — R7989 Other specified abnormal findings of blood chemistry: Secondary | ICD-10-CM | POA: Diagnosis not present

## 2018-12-10 DIAGNOSIS — Z79899 Other long term (current) drug therapy: Secondary | ICD-10-CM | POA: Diagnosis not present

## 2018-12-10 DIAGNOSIS — Z7984 Long term (current) use of oral hypoglycemic drugs: Secondary | ICD-10-CM | POA: Diagnosis not present

## 2018-12-10 DIAGNOSIS — Z885 Allergy status to narcotic agent status: Secondary | ICD-10-CM | POA: Diagnosis not present

## 2018-12-10 LAB — I-STAT CG4 LACTIC ACID, ED
Lactic Acid, Venous: 2.73 mmol/L (ref 0.5–1.9)
Lactic Acid, Venous: 2.89 mmol/L (ref 0.5–1.9)

## 2018-12-10 LAB — COMPREHENSIVE METABOLIC PANEL
ALK PHOS: 63 U/L (ref 38–126)
ALT: 24 U/L (ref 0–44)
AST: 30 U/L (ref 15–41)
Albumin: 3.4 g/dL — ABNORMAL LOW (ref 3.5–5.0)
Anion gap: 14 (ref 5–15)
BUN: 14 mg/dL (ref 8–23)
CALCIUM: 9 mg/dL (ref 8.9–10.3)
CHLORIDE: 102 mmol/L (ref 98–111)
CO2: 23 mmol/L (ref 22–32)
CREATININE: 0.77 mg/dL (ref 0.44–1.00)
GFR calc non Af Amer: >60 mL/min (ref >60)
GLUCOSE: 190 mg/dL — AB (ref 70–99)
Potassium: 3.5 mmol/L (ref 3.5–5.1)
SODIUM: 139 mmol/L (ref 135–145)
Total Bilirubin: 0.2 mg/dL — ABNORMAL LOW (ref 0.3–1.2)
Total Protein: 6.6 g/dL (ref 6.5–8.1)

## 2018-12-10 LAB — CBC
HCT: 38.4 % (ref 36.0–46.0)
Hemoglobin: 12.1 g/dL (ref 12.0–15.0)
MCH: 28.4 pg (ref 26.0–34.0)
MCHC: 31.5 g/dL (ref 30.0–36.0)
MCV: 90.1 fL (ref 80.0–100.0)
NRBC: 0 % (ref 0.0–0.2)
Platelets: 258 10*3/uL (ref 150–400)
RBC: 4.26 MIL/uL (ref 3.87–5.11)
RDW: 13.8 % (ref 11.5–15.5)
WBC: 4.9 10*3/uL (ref 4.0–10.5)

## 2018-12-10 LAB — GLUCOSE, CAPILLARY
Glucose-Capillary: 165 mg/dL — ABNORMAL HIGH (ref 70–99)
Glucose-Capillary: 185 mg/dL — ABNORMAL HIGH (ref 70–99)

## 2018-12-10 MED ORDER — OMEPRAZOLE MAGNESIUM 20 MG PO TBEC: 20.0000 mg | DELAYED_RELEASE_TABLET | Freq: Every day | ORAL | Status: DC

## 2018-12-10 MED ORDER — ATORVASTATIN CALCIUM 40 MG PO TABS
80.0000 mg | ORAL_TABLET | Freq: Every day | ORAL | Status: DC
  Administered 2018-12-10: 80 mg via ORAL
  Filled 2018-12-10 (×2): qty 2

## 2018-12-10 MED ORDER — GUAIFENESIN 100 MG/5ML PO SOLN: 5.0000 mL | ORAL | Status: DC | PRN

## 2018-12-10 MED ORDER — INSULIN ASPART 100 UNIT/ML ~~LOC~~ SOLN: 0.0000 [IU] | Freq: Every day | SUBCUTANEOUS | Status: DC

## 2018-12-10 MED ORDER — GLUCOSAMINE 1500 COMPLEX PO CAPS: 1.0000 | ORAL_CAPSULE | Freq: Three times a day (TID) | ORAL | Status: DC

## 2018-12-10 MED ORDER — ONDANSETRON HCL 4 MG PO TABS: 4.0000 mg | ORAL_TABLET | Freq: Four times a day (QID) | ORAL | Status: DC | PRN

## 2018-12-10 MED ORDER — IBUPROFEN 200 MG PO TABS: 400.0000 mg | ORAL_TABLET | Freq: Four times a day (QID) | ORAL | Status: DC | PRN

## 2018-12-10 MED ORDER — PANTOPRAZOLE SODIUM 40 MG PO TBEC: 40.0000 mg | DELAYED_RELEASE_TABLET | Freq: Every day | ORAL | Status: DC

## 2018-12-10 MED ORDER — OMEGA-3 FATTY ACIDS 1000 MG PO CAPS: 1.0000 g | ORAL_CAPSULE | Freq: Three times a day (TID) | ORAL | Status: DC

## 2018-12-10 MED ORDER — ACETAMINOPHEN 325 MG PO TABS
650.0000 mg | ORAL_TABLET | Freq: Four times a day (QID) | ORAL | Status: DC | PRN
  Filled 2018-12-10: qty 2

## 2018-12-10 MED ORDER — ACETAMINOPHEN 650 MG RE SUPP
650.0000 mg | Freq: Four times a day (QID) | RECTAL | Status: DC | PRN
  Filled 2018-12-10: qty 1

## 2018-12-10 MED ORDER — ADULT MULTIVITAMIN W/MINERALS CH: 1.0000 | ORAL_TABLET | Freq: Every day | ORAL | Status: DC

## 2018-12-10 MED ORDER — FERROUS SULFATE 325 (65 FE) MG PO TABS: 325.0000 mg | ORAL_TABLET | Freq: Every day | ORAL | Status: DC

## 2018-12-10 MED ORDER — INSULIN ASPART 100 UNIT/ML ~~LOC~~ SOLN
0.0000 [IU] | Freq: Three times a day (TID) | SUBCUTANEOUS | Status: DC
  Administered 2018-12-10 – 2018-12-11 (×2): 3 [IU] via SUBCUTANEOUS
  Administered 2018-12-11: 2 [IU] via SUBCUTANEOUS

## 2018-12-10 MED ORDER — SODIUM CHLORIDE 0.9 % IV BOLUS
1000.0000 mL | Freq: Once | INTRAVENOUS | Status: AC
  Administered 2018-12-10: 1000 mL via INTRAVENOUS

## 2018-12-10 MED ORDER — OMEGA-3-ACID ETHYL ESTERS 1 G PO CAPS
1.0000 g | ORAL_CAPSULE | Freq: Two times a day (BID) | ORAL | Status: DC
  Administered 2018-12-10 – 2018-12-11 (×2): 1 g via ORAL
  Filled 2018-12-10 (×2): qty 1

## 2018-12-10 MED ORDER — VENLAFAXINE HCL ER 37.5 MG PO CP24: 37.5000 mg | ORAL_CAPSULE | Freq: Every day | ORAL | Status: DC

## 2018-12-10 MED ORDER — SODIUM CHLORIDE 0.9 % IV SOLN
INTRAVENOUS | Status: DC
  Administered 2018-12-10: 16:00:00 via INTRAVENOUS

## 2018-12-10 MED ORDER — OSELTAMIVIR PHOSPHATE 75 MG PO CAPS
75.0000 mg | ORAL_CAPSULE | Freq: Once | ORAL | Status: AC
  Administered 2018-12-10: 75 mg via ORAL
  Filled 2018-12-10: qty 1

## 2018-12-10 MED ORDER — ENOXAPARIN SODIUM 40 MG/0.4ML ~~LOC~~ SOLN
40.0000 mg | SUBCUTANEOUS | Status: DC
  Administered 2018-12-10: 40 mg via SUBCUTANEOUS

## 2018-12-10 MED ORDER — OSELTAMIVIR PHOSPHATE 75 MG PO CAPS
75.0000 mg | ORAL_CAPSULE | Freq: Two times a day (BID) | ORAL | Status: DC
  Administered 2018-12-10 – 2018-12-11 (×2): 75 mg via ORAL
  Filled 2018-12-10 (×3): qty 1

## 2018-12-10 MED ORDER — ONDANSETRON HCL 4 MG/2ML IJ SOLN: 4.0000 mg | Freq: Four times a day (QID) | INTRAMUSCULAR | Status: DC | PRN

## 2018-12-10 NOTE — ED Notes (Signed)
I gave critical I Stat CG4 results to MD Ray

## 2018-12-10 NOTE — Progress Notes (Signed)
PHARMACIST - PHYSICIAN ORDER COMMUNICATION  CONCERNING: P&T Medication Policy on Herbal Medications  DESCRIPTION:  This patient's order for:  Glucosamine  has been noted.  This product(s) is classified as an "herbal" or natural product. Due to a lack of definitive safety studies or FDA approval, nonstandard manufacturing practices, plus the potential risk of unknown drug-drug interactions while on inpatient medications, the Pharmacy and Therapeutics Committee does not permit the use of "herbal" or natural products of this type within [facility].   ACTION TAKEN: The pharmacy department is unable to verify this order at this time and your patient has been informed of this safety policy. Please reevaluate patient's clinical condition at discharge and address if the herbal or natural product(s) should be resumed at that time.  Dia Sitter, PharmD, BCPS 12/10/2018 3:17 PM

## 2018-12-10 NOTE — ED Triage Notes (Signed)
Patient here from PCP via EMS reporting that she was diagnosed with the flu today. While at office patient had syncopal episode lasting 8 secs. Reports feeling weak, dizziness, and fatigue.

## 2018-12-10 NOTE — ED Notes (Signed)
I attempted to collect the I Stat and was unsuccessful.  I made the nurse aware.

## 2018-12-10 NOTE — ED Provider Notes (Addendum)
[facility] DEPT Provider Note   CSN: [id number] Arrival date & time: 12/10/18  1045      History   Chief Complaint Chief Complaint  Patient presents with  . Influenza  . Loss of Consciousness    HPI Margaret Zhang is a 72 y.o. female.  HPI 72 yo female seen at urgent care today for dry cough, body aches and diagnosed with flu.  While there, she had a syncopal episode.  She had prodrome of lightheaded.  Denies any prior episodes.  No nausea or vomiting but has not felt like eating or drinking much since ILI symptoms began on THursday night.   Past Medical History:  Diagnosis Date  . Anemia   . Anxiety   . Anxiety   . 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 Colon polyp   . Diabetes mellitus without complication ([place])    type 2  . GERD (gastroesophageal reflux disease)   . Hyperlipidemia   . Hypertension   . Obesity   . Obesity   . Osteoarthritis    hands -.Dr Fredna Dow  . Osteoarthrosis, unspecified whether generalized or localized, hand   . Vitamin D deficiency     Patient Active Problem List   Diagnosis Date Noted  . Diabetes mellitus ([place]) 08/16/2014  . Murmur 08/16/2014  . Dyspnea 08/16/2014  . Hypertension   . Anxiety   . Obesity   . Anemia   . Osteoarthritis   . Cancer ([place])   . Hyponatremia 11/25/2012  . Hypokalemia 11/25/2012  . Acute blood loss anemia 11/25/2012  . Osteoarthritis of knee 11/23/2012    Past Surgical History:  Procedure Laterality Date  .  breast biopsy     benign cyst - 1981  . ABDOMINAL HYSTERECTOMY    . APPENDECTOMY    . BREAST SURGERY    . KNEE SURGERY  2011   left -arthroscopy  . TOTAL ABDOMINAL HYSTERECTOMY  1977   unilateral due to endometriosis  . TOTAL KNEE ARTHROPLASTY  11/23/2012   Procedure: TOTAL KNEE ARTHROPLASTY;  Surgeon: Tobi Bastos, MD;  Location: [facility];  Service: Orthopedics;  Laterality: Left;     OB History   No obstetric history on file.      Home  Medications    Prior to Admission medications   Medication Sig Start Date End Date Taking? Authorizing Provider  amLODipine (NORVASC) 5 MG tablet Take 5 mg by mouth daily before breakfast.    [provider]  atorvastatin (LIPITOR) 80 MG tablet Take 80 mg by mouth at bedtime.    [provider]  fish oil-omega-3 fatty acids 1000 MG capsule Take 1 g by mouth 3 (three) times daily.    [provider]  glipiZIDE (GLUCOTROL XL) 10 MG 24 hr tablet Take 10 mg by mouth daily with breakfast.    [provider]  Glucosamine-Chondroit-Vit C-Mn (GLUCOSAMINE 1500 COMPLEX PO) Take 1 tablet by mouth 3 (three) times daily.    [provider]  lisinopril-hydrochlorothiazide (PRINZIDE,ZESTORETIC) 20-25 MG per tablet Take 1 tablet by mouth daily before breakfast.    [provider]  metFORMIN (GLUCOPHAGE) 500 MG tablet Take 500-1,000 mg by mouth 2 (two) times daily with a meal.     [provider]  Multiple Vitamin (MULTIVITAMIN WITH MINERALS) TABS Take 1 tablet by mouth daily.    [provider]  omeprazole (PRILOSEC OTC) 20 MG tablet Take 20 mg by mouth daily.  [provider]  venlafaxine XR (EFFEXOR-XR) 37.5 MG 24 hr capsule TAKE ONE CAPSULE BY MOUTH WITH FOOD EVERY DAY 02/25/17   [provider]  Vitamin D, Ergocalciferol, (DRISDOL) 50000 UNITS CAPS Take 50,000 Units by mouth every 7 (seven) days. wednesday    [provider]    Family History Family History  Problem Relation Age of Onset  . Lung cancer Father   . Hiatal hernia Mother   . Hypertension Mother   . Dementia Mother   . Hypertension Brother   . Diabetes Brother   . Healthy Brother     Social History Social History   Tobacco Use  . Smoking status: Never Smoker  . Smokeless tobacco: Never Used  Substance Use Topics  . Alcohol use: No  . Drug use: No     Allergies   Morphine and related   Review of Systems Review of Systems    Constitutional: Positive for appetite change, chills and fatigue. Negative for activity change, fever and unexpected weight change.  HENT: Negative.   Eyes: Negative.   Respiratory: Positive for cough.   Cardiovascular: Negative.   Gastrointestinal: Negative.   Endocrine: Negative.   Genitourinary: Negative.   Musculoskeletal: Positive for myalgias.  Skin: Negative.   Allergic/Immunologic: Negative.   Neurological: Positive for light-headedness.  All other systems reviewed and are negative.    Physical Exam Updated Vital Signs BP (!) 118/58 (BP Location: Right Arm)   Pulse 74   Temp 98.4 F (36.9 C) (Oral)   Resp 18   SpO2 93%   Physical Exam Vitals signs and nursing note reviewed.  Constitutional:      Appearance: She is obese. She is ill-appearing.  HENT:     Head: Normocephalic.     Right Ear: External ear normal.     Left Ear: External ear normal.     Nose: Nose normal.     Mouth/Throat:     Mouth: Mucous membranes are dry.  Eyes:     Extraocular Movements: Extraocular movements intact.     Pupils: Pupils are equal, round, and reactive to light.  Neck:     Musculoskeletal: Normal range of motion.  Cardiovascular:     Rate and Rhythm: Normal rate and regular rhythm.  Pulmonary:     Effort: Pulmonary effort is normal.  Abdominal:     Palpations: Abdomen is soft.  Musculoskeletal: Normal range of motion.  Skin:    General: Skin is warm and dry.     Capillary Refill: Capillary refill takes less than 2 seconds.  Neurological:     General: No focal deficit present.     Mental Status: She is alert.  Psychiatric:        Mood and Affect: Mood normal.      ED Treatments / Results  Labs (all labs ordered are listed, but only abnormal results are displayed) Labs Reviewed  COMPREHENSIVE METABOLIC PANEL - Abnormal; Notable for the following components:      Result Value   Glucose, Bld 190 (*)    Albumin 3.4 (*)    Total Bilirubin 0.2 (*)    All other  components within normal limits  I-STAT CG4 LACTIC ACID, ED - Abnormal; Notable for the following components:   Lactic Acid, Venous 2.73 (*)    All other components within normal limits  CBC  I-STAT CG4 LACTIC ACID, ED    EKG EKG Interpretation  Date/Time:  Saturday December 10 2018 12:51:04 EST Ventricular Rate:  72 PR  Interval:  136 QRS Duration: 88 QT Interval:  376 QTC Calculation: 411 R Axis:   20 Text Interpretation:  Sinus rhythm with Premature atrial complexes Anterior infarct , age undetermined Abnormal ECG Confirmed by Pattricia Boss [phone]) on 12/10/2018 1:29:04 PM   Radiology Dg Chest Port 1 View  Result Date: 12/10/2018 CLINICAL DATA:  Flu, syncopal episode, weakness, dizziness, fatigue, type II diabetes mellitus, hypertension EXAM: PORTABLE CHEST 1 VIEW COMPARISON:  Portable exam 1138 hours compared to 11/17/2012 FINDINGS: Minimal enlargement of cardiac silhouette. Mediastinal contours and pulmonary vascularity normal. Lungs clear. No infiltrate, pleural effusion or pneumothorax. Bones unremarkable. IMPRESSION: Minimal enlargement of cardiac silhouette. No acute abnormalities. Electronically Signed   By: Lavonia Dana M.D.   On: 12/10/2018 11:54    Procedures Procedures (including critical care time)  Medications Ordered in ED Medications - No data to display   Initial Impression / Assessment and Plan / ED Course  I have reviewed the triage vital signs and the nursing notes.  Pertinent labs & imaging results that were available during my care of the patient were reviewed by me and considered in my medical decision making (see chart for details).    72 year old female presents today with positive flu and syncopal episode.  Patient was being seen in outlying urgent care where she was diagnosed with influenza A.  There she became lightheaded and had a syncopal episode. Here she has no focal infiltrate on cxr, Labs are without major metabolic anbormality.  However,  she has lactic acidosis.  She is receiving iv fluids, tamiflu ordered Plan admission for further evaluation and treatment Discussed with Dr. Clementeen Graham and will see for admission 1-FLU A 2- sepsis 3 syncope  Final Clinical Impressions(s) / ED Diagnoses   Final diagnoses:  Syncope, unspecified syncope type  Influenza A    ED Discharge Orders    None       Pattricia Boss, MD 12/10/18 1344    Pattricia Boss, MD 01/09/19 1504

## 2018-12-10 NOTE — H&P (Signed)
[facility] H&P   Patient Demographics:    Margaret Zhang, is a 72 y.o. female  MRN: [id number]   DOB - 07-18-1946  Admit Date - 12/10/2018  Outpatient Primary MD for the patient is Darcus Austin, MD  Referring MD: Dr. Jeanell Sparrow  Outpatient Specialists: None  Patient coming from: Home/sent from urgent care.  Chief Complaint  Patient presents with  . Influenza  . Loss of Consciousness      HPI:    Margaret Zhang  is a 72 y.o. female, with history of hypertension, diabetes mellitus on oral hypoglycemics, anxiety, iron deficiency anemia, hyperlipidemia who went to [facility] urgent care center this morning with 3-day history of diffuse body aches and chills.  She reported poor p.o. intake for the same duration however was continue to take her home blood pressure meds.  At the urgent care center she had a flu swab done that was positive for influenza A.  While waiting she felt dizzy and lightheaded and had a syncopal episode lasting for few seconds but did not fall on the ground or sustain any injury.  She was then transferred to [facility] long ED.  Patient denies any sick contact or recent travel. In the ED she was afebrile with normal heart rate, respiratory rate and maintaining sats in low 90s on room air.  Blood pressure 1 1 8/58 mmHg.  Blood work showed normal WBC, hemoglobin of 12.1, platelets of 258, normal chemistry including renal function.  Glucose was 190 and lactic acid elevated at 2.73.  Patient given 2 L normal saline bolus and started on Tamiflu.  Hospitalist consulted for observation overnight on telemetry.   Review of systems:    In addition to the HPI above,  Subjective fevers and chills Headache +, No changes with Vision or hearing, No problems swallowing food or Liquids, No Chest pain, Cough or Shortness of Breath, No Abdominal pain, No nausea or vomiting bowel movements are  regular, No Blood in stool or Urine, No dysuria, No new skin rashes or bruises, Generalized body ache+++ Weakness+, no tingling, numbness in any extremity, No recent weight gain or loss, No polyuria, polydypsia or polyphagia, No significant Mental Stressors.     With Past History of the following :    Past Medical History:  Diagnosis Date  . Anemia   . Anxiety   . Anxiety   . 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 Colon polyp   . Diabetes mellitus without complication ([place])    type 2  . GERD (gastroesophageal reflux disease)   . Hyperlipidemia   . Hypertension   . Obesity   . Obesity   . Osteoarthritis    hands -.Dr Fredna Dow  . Osteoarthrosis, unspecified whether generalized or localized, hand   . Vitamin D deficiency       Past Surgical History:  Procedure  Laterality Date  .  breast biopsy     benign cyst - 1981  . ABDOMINAL HYSTERECTOMY    . APPENDECTOMY    . BREAST SURGERY    . KNEE SURGERY  2011   left -arthroscopy  . TOTAL ABDOMINAL HYSTERECTOMY  1977   unilateral due to endometriosis  . TOTAL KNEE ARTHROPLASTY  11/23/2012   Procedure: TOTAL KNEE ARTHROPLASTY;  Surgeon: Tobi Bastos, MD;  Location: [facility];  Service: Orthopedics;  Laterality: Left;      Social History:     Social History   Tobacco Use  . Smoking status: Never Smoker  . Smokeless tobacco: Never Used  Substance Use Topics  . Alcohol use: No     Lives -home Mobility -independent    Family History :     Family History  Problem Relation Age of Onset  . Lung cancer Father   . Hiatal hernia Mother   . Hypertension Mother   . Dementia Mother   . Hypertension Brother   . Diabetes Brother   . Healthy Brother      Home Medications:   Prior to Admission medications   Medication Sig Start Date End Date Taking? Authorizing Provider  amLODipine (NORVASC) 5 MG tablet Take 5 mg by mouth daily before breakfast.   Yes [provider]  atorvastatin (LIPITOR) 80 MG  tablet Take 80 mg by mouth at bedtime.   Yes [provider]  ferrous sulfate 325 (65 FE) MG tablet Take 325 mg by mouth daily with breakfast.   Yes [provider]  fish oil-omega-3 fatty acids 1000 MG capsule Take 1 g by mouth 3 (three) times daily.   Yes [provider]  glipiZIDE (GLUCOTROL XL) 10 MG 24 hr tablet Take 10 mg by mouth daily with breakfast.   Yes [provider]  Glucosamine-Chondroit-Vit C-Mn (GLUCOSAMINE 1500 COMPLEX PO) Take 1 tablet by mouth 3 (three) times daily.   Yes [provider]  lisinopril-hydrochlorothiazide (PRINZIDE,ZESTORETIC) 20-25 MG per tablet Take 1 tablet by mouth daily before breakfast.   Yes [provider]  metFORMIN (GLUCOPHAGE) 500 MG tablet Take 1,000 mg by mouth 2 (two) times daily with a meal.    Yes [provider]  Multiple Vitamin (MULTIVITAMIN WITH MINERALS) TABS Take 1 tablet by mouth daily.   Yes [provider]  naproxen sodium (ALEVE) 220 MG tablet Take 220 mg by mouth every 12 (twelve) hours as needed (arthritis pain).    Yes [provider]  omeprazole (PRILOSEC OTC) 20 MG tablet Take 20 mg by mouth daily.   Yes [provider]  venlafaxine XR (EFFEXOR-XR) 37.5 MG 24 hr capsule Take 37.5 mg by mouth daily with breakfast.  02/25/17  Yes [provider]  Vitamin D, Ergocalciferol, (DRISDOL) 50000 UNITS CAPS Take 50,000 Units by mouth every 7 (seven) days. On Wednesday   Yes [provider]     Allergies:     Allergies  Allergen Reactions  . Morphine And Related Itching    Felt like bugs were crawling on her  . Oxycodone Other (See Comments)    Hallucinations     Physical Exam:   Vitals  Blood pressure (!) 118/58, pulse 74, temperature 98.4 F (36.9 C), temperature source Oral, resp. rate 18, SpO2 93 %.  General: Elderly pleasant female lying in bed in no acute distress HEENT: Pupils reactive bilaterally, EOMI, no pallor, no  icterus, moist mucosa, supple neck Chest: Clear to auscultation bilaterally, no added  sound CVs: Normal S1-S2, no murmurs rub or gallop GI: Soft, nondistended, nontender, bowel sounds present Musculoskeletal: Warm, no edema CNS: Alert and oriented, nonfocal   Data Review:    CBC Recent Labs  Lab 12/10/18 1158  WBC 4.9  HGB 12.1  HCT 38.4  PLT 258  MCV 90.1  MCH 28.4  MCHC 31.5  RDW 13.8   ------------------------------------------------------------------------------------------------------------------  Chemistries  Recent Labs  Lab 12/10/18 1158  NA 139  K 3.5  CL 102  CO2 23  GLUCOSE 190*  BUN 14  CREATININE 0.77  CALCIUM 9.0  AST 30  ALT 24  ALKPHOS 63  BILITOT 0.2*   ------------------------------------------------------------------------------------------------------------------ CrCl cannot be calculated (Unknown ideal weight.). ------------------------------------------------------------------------------------------------------------------ No results for input(s): TSH, T4TOTAL, T3FREE, THYROIDAB in the last 72 hours.  Invalid input(s): FREET3  Coagulation profile No results for input(s): INR, PROTIME in the last 168 hours. ------------------------------------------------------------------------------------------------------------------- No results for input(s): DDIMER in the last 72 hours. -------------------------------------------------------------------------------------------------------------------  Cardiac Enzymes No results for input(s): CKMB, TROPONINI, MYOGLOBIN in the last 168 hours.  Invalid input(s): CK ------------------------------------------------------------------------------------------------------------------ No results found for: BNP   ---------------------------------------------------------------------------------------------------------------  Urinalysis    Component Value Date/Time   COLORURINE YELLOW 11/17/2012 0926    APPEARANCEUR CLOUDY (A) 11/17/2012 0926   LABSPEC 1.022 11/17/2012 0926   PHURINE 5.0 11/17/2012 0926   GLUCOSEU NEGATIVE 11/17/2012 0926   HGBUR NEGATIVE 11/17/2012 0926   BILIRUBINUR NEGATIVE 11/17/2012 0926   KETONESUR NEGATIVE 11/17/2012 0926   PROTEINUR NEGATIVE 11/17/2012 0926   UROBILINOGEN 0.2 11/17/2012 0926   NITRITE NEGATIVE 11/17/2012 0926   LEUKOCYTESUR NEGATIVE 11/17/2012 0926    ----------------------------------------------------------------------------------------------------------------   Imaging Results:    Dg Chest Port 1 View  Result Date: 12/10/2018 CLINICAL DATA:  Flu, syncopal episode, weakness, dizziness, fatigue, type II diabetes mellitus, hypertension EXAM: PORTABLE CHEST 1 VIEW COMPARISON:  Portable exam 1138 hours compared to 11/17/2012 FINDINGS: Minimal enlargement of cardiac silhouette. Mediastinal contours and pulmonary vascularity normal. Lungs clear. No infiltrate, pleural effusion or pneumothorax. Bones unremarkable. IMPRESSION: Minimal enlargement of cardiac silhouette. No acute abnormalities. Electronically Signed   By: Lavonia Dana M.D.   On: 12/10/2018 11:54    My personal review of EKG: Normal sinus rhythm at 72 with PACs, no ST-T changes.   Assessment & Plan:    Principal Problem: Syncope and collapse Secondary to dehydration and associated influenza A.  No EKG changes, no further symptoms, chest discomfort or palpitations. Monitor on telemetry for observation overnight.  Blood pressure stable.  Received 2 L IV fluids in the ED, and maintenance normal saline.  Active Problems: Influenza A Associated symptoms of chills and generalized body ache with weakness and dehydration.  Was confirmed by ED physician upon calling to the [facility] urgent care center.  Started on Tamiflu.  Supportive care with Tylenol for fever and Motrin for pain.  Added antitussives. Droplet precautions.  Essential hypertension Blood pressure soft so will hold all  antihypertensives (she is on 3 different blood pressure meds).  Diabetes mellitus type 2, controlled Hold home oral meds (metformin and Glucotrol).  Monitor on sliding scale coverage.  Hyperlipidemia Continue statin  Iron deficiency anemia Hemoglobin stable.  Continue supplement.  DVT Prophylaxis: Lovenox  AM Labs Ordered, also please review Full Orders  Family Communication: Admission, patients condition and plan of care including tests being ordered have been discussed with the patient and her son at bedside  Code Status full code  Likely DC to home tomorrow if clinically improved  Condition: [place]  called: None  Admission status: Observation  Time spent in minutes : 50   Adahlia Stembridge M.D on 12/10/2018 at 2:03 PM  Between 7am to 7pm - Pager - [phone]. After 7pm go to [email or web address] - password [facility]  [facility] - Office  [phone]

## 2018-12-11 DIAGNOSIS — J101 Influenza due to other identified influenza virus with other respiratory manifestations: Secondary | ICD-10-CM | POA: Diagnosis not present

## 2018-12-11 LAB — GLUCOSE, CAPILLARY
Glucose-Capillary: 150 mg/dL — ABNORMAL HIGH (ref 70–99)
Glucose-Capillary: 190 mg/dL — ABNORMAL HIGH (ref 70–99)

## 2018-12-11 MED ORDER — OSELTAMIVIR PHOSPHATE 75 MG PO CAPS: 75.0000 mg | ORAL_CAPSULE | Freq: Two times a day (BID) | ORAL | 0 refills | Status: DC

## 2018-12-11 MED ORDER — ONDANSETRON HCL 4 MG PO TABS: 4.0000 mg | ORAL_TABLET | Freq: Four times a day (QID) | ORAL | 0 refills | Status: DC | PRN

## 2018-12-11 NOTE — Plan of Care (Signed)
Pt has been calm and cooperative throughout shift. Pt had no C/O pain or discomfort.

## 2018-12-11 NOTE — Discharge Summary (Signed)
Margaret Zhang, is a 72 y.o. female  DOB [birth year]  MRN [id number].  Admission date:  12/10/2018  Admitting Physician  No admitting provider for patient encounter.  Discharge Date:  12/12/2018   Primary MD  Darcus Austin, MD  Recommendations for primary care physician for things to follow:  CBC AND CMP   Admission Diagnosis  Influenza A [J10.1] Syncope, unspecified syncope type [R55]   Discharge Diagnosis  Influenza A [J10.1] Syncope, unspecified syncope type [R55]   Principal Problem:   Influenza A Active Problems:   Hypertension   Anxiety   Syncope   Elevated lactic acid level      Past Medical History:  Diagnosis Date  . Anemia   . Anxiety   . Anxiety   . 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 Colon polyp   . Diabetes mellitus without complication ([place])    type 2  . GERD (gastroesophageal reflux disease)   . Hyperlipidemia   . Hypertension   . Obesity   . Obesity   . Osteoarthritis    hands -.Dr Fredna Dow  . Osteoarthrosis, unspecified whether generalized or localized, hand   . Vitamin D deficiency     Past Surgical History:  Procedure Laterality Date  .  breast biopsy     benign cyst - 1981  . ABDOMINAL HYSTERECTOMY    . APPENDECTOMY    . BREAST SURGERY    . KNEE SURGERY  2011   left -arthroscopy  . TOTAL ABDOMINAL HYSTERECTOMY  1977   unilateral due to endometriosis  . TOTAL KNEE ARTHROPLASTY  11/23/2012   Procedure: TOTAL KNEE ARTHROPLASTY;  Surgeon: Tobi Bastos, MD;  Location: [facility];  Service: Orthopedics;  Laterality: Left;       HPI  from the history and physical done on the day of admission:    Margaret Zhang  is a 72 y.o. female, with history of hypertension, diabetes mellitus on oral hypoglycemics, anxiety, iron deficiency anemia, hyperlipidemia who went to [facility] urgent care center this morning with 3-day history of diffuse body aches and  chills.  She reported poor p.o. intake for the same duration however was continue to take her home blood pressure meds.  At the urgent care center she had a flu swab done that was positive for influenza A.  While waiting she felt dizzy and lightheaded and had a syncopal episode lasting for few seconds but did not fall on the ground or sustain any injury.  She was then transferred to [facility] long ED.  Patient denies any sick contact or recent travel. In the ED she was afebrile with normal heart rate, respiratory rate and maintaining sats in low 90s on room air.  Blood pressure 1 1 8/58 mmHg.  Blood work showed normal WBC, hemoglobin of 12.1, platelets of 258, normal chemistry including renal function.  Glucose was 190 and lactic acid elevated at 2.73.  Patient given 2 L normal saline bolus and started on Tamiflu.  Hospitalist consulted for  observation overnight on telemetry.     Hospital Course:    Syncope and collapse Secondary to dehydration and associated influenza A.  No EKG changes, no further symptoms, chest discomfort or palpitations. Monitor on telemetry for observation overnight.  Blood pressure stable.  Received 2 L IV fluids in the ED, and maintenance normal saline.  Active Problems: Influenza A Associated symptoms of chills and generalized body ache with weakness and dehydration.  Was confirmed by ED physician upon calling to the [facility] urgent care center.  Started on Tamiflu.  Supportive care with Tylenol for fever and Motrin for pain.  Added antitussives. Droplet precautions.  Essential hypertension Blood pressure soft so will hold all antihypertensives (she is on 3 different blood pressure meds).  Diabetes mellitus type 2, controlled Held home oral meds (metformin and Glucotrol).  Monitor on sliding scale coverage.  Hyperlipidemia Continue statin  Iron deficiency anemia Hemoglobin stable.  Continue supplement.       Discharge Condition: Improved and  satisfactory  Follow UP -pcp     Consults obtained -not applicable Diet and Activity recommendation:  As advised  Discharge Instructions    Discharge Instructions    Call MD for:  difficulty breathing, headache or visual disturbances   Complete by:  As directed    Call MD for:  extreme fatigue   Complete by:  As directed    Call MD for:  hives   Complete by:  As directed    Call MD for:  persistant dizziness or light-headedness   Complete by:  As directed    Call MD for:  persistant nausea and vomiting   Complete by:  As directed    Call MD for:  redness, tenderness, or signs of infection (pain, swelling, redness, odor or green/yellow discharge around incision site)   Complete by:  As directed    Call MD for:  severe uncontrolled pain   Complete by:  As directed    Call MD for:  temperature >100.4   Complete by:  As directed    Diet - low sodium heart healthy   Complete by:  As directed    Increase activity slowly   Complete by:  As directed         Discharge Medications     Allergies as of 12/11/2018      Reactions   Morphine And Related Itching   Felt like bugs were crawling on her   Oxycodone Other (See Comments)   Hallucinations      Medication List    TAKE these medications   amLODipine 5 MG tablet Commonly known as:  NORVASC Take 5 mg by mouth daily before breakfast.   atorvastatin 80 MG tablet Commonly known as:  LIPITOR Take 80 mg by mouth at bedtime.   ferrous sulfate 325 (65 FE) MG tablet Take 325 mg by mouth daily with breakfast.   fish oil-omega-3 fatty acids 1000 MG capsule Take 1 g by mouth 3 (three) times daily.   glipiZIDE 10 MG 24 hr tablet Commonly known as:  GLUCOTROL XL Take 10 mg by mouth daily with breakfast.   GLUCOSAMINE 1500 COMPLEX PO Take 1 tablet by mouth 3 (three) times daily.   lisinopril-hydrochlorothiazide 20-25 MG tablet Commonly known as:  PRINZIDE,ZESTORETIC Take 1 tablet by mouth daily before breakfast.    metFORMIN 500 MG tablet Commonly known as:  GLUCOPHAGE Take 1,000 mg by mouth 2 (two) times daily with a meal.   multivitamin with minerals Tabs tablet Take 1 tablet  by mouth daily.   naproxen sodium 220 MG tablet Commonly known as:  ALEVE Take 220 mg by mouth every 12 (twelve) hours as needed (arthritis pain).   omeprazole 20 MG tablet Commonly known as:  PRILOSEC OTC Take 20 mg by mouth daily.   ondansetron 4 MG tablet Commonly known as:  ZOFRAN Take 1 tablet (4 mg total) by mouth every 6 (six) hours as needed for nausea.   oseltamivir 75 MG capsule Commonly known as:  TAMIFLU Take 1 capsule (75 mg total) by mouth 2 (two) times daily.   venlafaxine XR 37.5 MG 24 hr capsule Commonly known as:  EFFEXOR-XR Take 37.5 mg by mouth daily with breakfast.   Vitamin D (Ergocalciferol) 1.25 MG (50000 UT) Caps capsule Commonly known as:  DRISDOL Take 50,000 Units by mouth every 7 (seven) days. On Wednesday       Major procedures and Radiology Reports - PLEASE review detailed and final reports for all details, in brief -      Dg Chest Port 1 View  Result Date: 12/10/2018 CLINICAL DATA:  Flu, syncopal episode, weakness, dizziness, fatigue, type II diabetes mellitus, hypertension EXAM: PORTABLE CHEST 1 VIEW COMPARISON:  Portable exam 1138 hours compared to 11/17/2012 FINDINGS: Minimal enlargement of cardiac silhouette. Mediastinal contours and pulmonary vascularity normal. Lungs clear. No infiltrate, pleural effusion or pneumothorax. Bones unremarkable. IMPRESSION: Minimal enlargement of cardiac silhouette. No acute abnormalities. Electronically Signed   By: Lavonia Dana M.D.   On: 12/10/2018 11:54    Micro Results     No results found for this or any previous visit (from the past 240 hour(s)).     Today   Subjective    Margaret Zhang today has no dizziness, no fever or chills, no pain, no nausea or vomiting.  Patient has been ambulating today without any problems.   She has been able to to keep food down and would like to go home          Patient has been seen and examined prior to discharge   Objective   Blood pressure 130/62, pulse 64, temperature 98.9 F (37.2 C), temperature source Oral, resp. rate 16, height 5\' 2"  (1.575 m), weight 88.6 kg, SpO2 91 %.  No intake or output data in the 24 hours ending 12/12/18 1649  Exam Gen:- Awake, comfortable, no distress HEENT:- [place].AT, mucous membranes moist Neck-Supple Neck,No JVD,  Lungs- mostly clear CV- S1, S2 normal Abd-  +ve B.Sounds, Abd Soft, No tenderness,    Extremity/Skin:- Intact peripheral pulses     Data Review   CBC w Diff:  Lab Results  Component Value Date   WBC 4.9 12/10/2018   HGB 12.1 12/10/2018   HCT 38.4 12/10/2018   PLT 258 12/10/2018    CMP:  Lab Results  Component Value Date   NA 139 12/10/2018   K 3.5 12/10/2018   CL 102 12/10/2018   CO2 23 12/10/2018   BUN 14 12/10/2018   CREATININE 0.77 12/10/2018   PROT 6.6 12/10/2018   ALBUMIN 3.4 (L) 12/10/2018   BILITOT 0.2 (L) 12/10/2018   ALKPHOS 63 12/10/2018   AST 30 12/10/2018   ALT 24 12/10/2018  .   Total Discharge time is about 33 minutes  Benito Mccreedy M.D on 12/12/2018 at 4:49 PM  [facility]   Office  [phone]  Dragon dictation system was used to create this note, attempts have been made to correct errors, however presence of uncorrected errors is not a reflection quality  of care provided

## 2019-03-16 ENCOUNTER — Encounter (HOSPITAL_BASED_OUTPATIENT_CLINIC_OR_DEPARTMENT_OTHER): Payer: Self-pay

## 2019-03-16 ENCOUNTER — Ambulatory Visit (HOSPITAL_BASED_OUTPATIENT_CLINIC_OR_DEPARTMENT_OTHER): Admit: 2019-03-16 | Payer: Medicare Other | Admitting: Orthopedic Surgery

## 2019-03-16 SURGERY — FINGER ARTHROSCOPY WITH CARPOMETACARPEL (CMC) ARTHROPLASTY
Anesthesia: Regional | Laterality: Right

## 2019-05-01 ENCOUNTER — Other Ambulatory Visit: Payer: Self-pay | Admitting: Family Medicine

## 2019-05-01 DIAGNOSIS — Z1231 Encounter for screening mammogram for malignant neoplasm of breast: Secondary | ICD-10-CM

## 2019-06-22 ENCOUNTER — Other Ambulatory Visit: Payer: Self-pay

## 2019-06-22 ENCOUNTER — Ambulatory Visit
Admission: RE | Admit: 2019-06-22 | Discharge: 2019-06-22 | Disposition: A | Discharge status: Home / Self Care | Payer: Medicare Other | Source: Ambulatory Visit | Attending: Family Medicine | Admitting: Family Medicine

## 2019-06-22 DIAGNOSIS — Z1231 Encounter for screening mammogram for malignant neoplasm of breast: Secondary | ICD-10-CM

## 2020-05-23 ENCOUNTER — Other Ambulatory Visit: Payer: Self-pay | Admitting: Family Medicine

## 2020-05-23 DIAGNOSIS — Z1231 Encounter for screening mammogram for malignant neoplasm of breast: Secondary | ICD-10-CM

## 2020-06-24 ENCOUNTER — Ambulatory Visit: Payer: Medicare Other

## 2020-06-27 ENCOUNTER — Ambulatory Visit
Admission: RE | Admit: 2020-06-27 | Discharge: 2020-06-27 | Disposition: A | Discharge status: Home / Self Care | Payer: Medicare PPO | Source: Ambulatory Visit | Attending: Family Medicine | Admitting: Family Medicine

## 2020-06-27 ENCOUNTER — Other Ambulatory Visit: Payer: Self-pay

## 2020-06-27 DIAGNOSIS — Z1231 Encounter for screening mammogram for malignant neoplasm of breast: Secondary | ICD-10-CM

## 2020-08-19 DIAGNOSIS — E119 Type 2 diabetes mellitus without complications: Secondary | ICD-10-CM | POA: Diagnosis not present

## 2020-08-19 DIAGNOSIS — H25813 Combined forms of age-related cataract, bilateral: Secondary | ICD-10-CM | POA: Diagnosis not present

## 2020-08-19 DIAGNOSIS — H02834 Dermatochalasis of left upper eyelid: Secondary | ICD-10-CM | POA: Diagnosis not present

## 2020-08-19 DIAGNOSIS — H02831 Dermatochalasis of right upper eyelid: Secondary | ICD-10-CM | POA: Diagnosis not present

## 2020-09-06 DIAGNOSIS — Z20822 Contact with and (suspected) exposure to covid-19: Secondary | ICD-10-CM | POA: Diagnosis not present

## 2020-09-12 DIAGNOSIS — H2512 Age-related nuclear cataract, left eye: Secondary | ICD-10-CM | POA: Diagnosis not present

## 2020-10-22 DIAGNOSIS — H2511 Age-related nuclear cataract, right eye: Secondary | ICD-10-CM | POA: Diagnosis not present

## 2020-10-24 DIAGNOSIS — H401111 Primary open-angle glaucoma, right eye, mild stage: Secondary | ICD-10-CM | POA: Diagnosis not present

## 2020-10-24 DIAGNOSIS — H2511 Age-related nuclear cataract, right eye: Secondary | ICD-10-CM | POA: Diagnosis not present

## 2020-11-25 DIAGNOSIS — I10 Essential (primary) hypertension: Secondary | ICD-10-CM | POA: Diagnosis not present

## 2020-11-25 DIAGNOSIS — E785 Hyperlipidemia, unspecified: Secondary | ICD-10-CM | POA: Diagnosis not present

## 2020-11-25 DIAGNOSIS — E1165 Type 2 diabetes mellitus with hyperglycemia: Secondary | ICD-10-CM | POA: Diagnosis not present

## 2021-01-03 DIAGNOSIS — Z20822 Contact with and (suspected) exposure to covid-19: Secondary | ICD-10-CM | POA: Diagnosis not present

## 2021-01-03 DIAGNOSIS — Z03818 Encounter for observation for suspected exposure to other biological agents ruled out: Secondary | ICD-10-CM | POA: Diagnosis not present

## 2021-03-31 DIAGNOSIS — M25511 Pain in right shoulder: Secondary | ICD-10-CM | POA: Diagnosis not present

## 2021-04-20 DIAGNOSIS — Z03818 Encounter for observation for suspected exposure to other biological agents ruled out: Secondary | ICD-10-CM | POA: Diagnosis not present

## 2021-04-20 DIAGNOSIS — Z20822 Contact with and (suspected) exposure to covid-19: Secondary | ICD-10-CM | POA: Diagnosis not present

## 2021-04-20 DIAGNOSIS — J301 Allergic rhinitis due to pollen: Secondary | ICD-10-CM | POA: Diagnosis not present

## 2021-04-22 DIAGNOSIS — M25511 Pain in right shoulder: Secondary | ICD-10-CM | POA: Diagnosis not present

## 2021-04-28 DIAGNOSIS — M25511 Pain in right shoulder: Secondary | ICD-10-CM | POA: Diagnosis not present

## 2021-04-29 DIAGNOSIS — Z85828 Personal history of other malignant neoplasm of skin: Secondary | ICD-10-CM | POA: Diagnosis not present

## 2021-04-29 DIAGNOSIS — L280 Lichen simplex chronicus: Secondary | ICD-10-CM | POA: Diagnosis not present

## 2021-05-13 DIAGNOSIS — M25511 Pain in right shoulder: Secondary | ICD-10-CM | POA: Diagnosis not present

## 2021-05-13 DIAGNOSIS — M75121 Complete rotator cuff tear or rupture of right shoulder, not specified as traumatic: Secondary | ICD-10-CM | POA: Diagnosis not present

## 2021-05-19 DIAGNOSIS — G5601 Carpal tunnel syndrome, right upper limb: Secondary | ICD-10-CM | POA: Diagnosis not present

## 2021-05-27 ENCOUNTER — Other Ambulatory Visit: Payer: Self-pay | Admitting: Family Medicine

## 2021-05-27 DIAGNOSIS — H43813 Vitreous degeneration, bilateral: Secondary | ICD-10-CM | POA: Diagnosis not present

## 2021-05-27 DIAGNOSIS — H02831 Dermatochalasis of right upper eyelid: Secondary | ICD-10-CM | POA: Diagnosis not present

## 2021-05-27 DIAGNOSIS — Z1231 Encounter for screening mammogram for malignant neoplasm of breast: Secondary | ICD-10-CM

## 2021-05-27 DIAGNOSIS — E119 Type 2 diabetes mellitus without complications: Secondary | ICD-10-CM | POA: Diagnosis not present

## 2021-05-27 DIAGNOSIS — H02834 Dermatochalasis of left upper eyelid: Secondary | ICD-10-CM | POA: Diagnosis not present

## 2021-05-27 DIAGNOSIS — Z961 Presence of intraocular lens: Secondary | ICD-10-CM | POA: Diagnosis not present

## 2021-05-28 DIAGNOSIS — R2231 Localized swelling, mass and lump, right upper limb: Secondary | ICD-10-CM | POA: Diagnosis not present

## 2021-05-28 DIAGNOSIS — M75121 Complete rotator cuff tear or rupture of right shoulder, not specified as traumatic: Secondary | ICD-10-CM | POA: Diagnosis not present

## 2021-06-12 DIAGNOSIS — Z0000 Encounter for general adult medical examination without abnormal findings: Secondary | ICD-10-CM | POA: Diagnosis not present

## 2021-06-12 DIAGNOSIS — Z7984 Long term (current) use of oral hypoglycemic drugs: Secondary | ICD-10-CM | POA: Diagnosis not present

## 2021-06-12 DIAGNOSIS — D500 Iron deficiency anemia secondary to blood loss (chronic): Secondary | ICD-10-CM | POA: Diagnosis not present

## 2021-06-12 DIAGNOSIS — I10 Essential (primary) hypertension: Secondary | ICD-10-CM | POA: Diagnosis not present

## 2021-06-12 DIAGNOSIS — E559 Vitamin D deficiency, unspecified: Secondary | ICD-10-CM | POA: Diagnosis not present

## 2021-06-12 DIAGNOSIS — E1165 Type 2 diabetes mellitus with hyperglycemia: Secondary | ICD-10-CM | POA: Diagnosis not present

## 2021-06-12 DIAGNOSIS — E785 Hyperlipidemia, unspecified: Secondary | ICD-10-CM | POA: Diagnosis not present

## 2021-06-24 ENCOUNTER — Encounter: Payer: Self-pay | Admitting: Endocrinology

## 2021-06-24 ENCOUNTER — Other Ambulatory Visit: Payer: Self-pay

## 2021-06-24 ENCOUNTER — Ambulatory Visit: Payer: Medicare PPO | Admitting: Endocrinology

## 2021-06-24 VITALS — BP 112/68 | HR 69 | Ht 60.5 in | Wt 201.6 lb

## 2021-06-24 DIAGNOSIS — E119 Type 2 diabetes mellitus without complications: Secondary | ICD-10-CM | POA: Diagnosis not present

## 2021-06-24 MED ORDER — TIRZEPATIDE 2.5 MG/0.5ML ~~LOC~~ SOAJ
2.5000 mg | SUBCUTANEOUS | 0 refills | Status: DC
Start: 2021-06-24 — End: 2021-07-25

## 2021-06-24 NOTE — Progress Notes (Addendum)
Subjective:    Patient ID: Margaret Zhang, female    DOB: Aug 18, 1946, 75 y.o.   MRN: [id number]  HPI pt is referred by Dr Justin Mend, for diabetes.  Pt states DM was dx'ed in 2002; she is unaware of any chronic complications; she has never been on insulin; pt says her diet and exercise are fair; she has never had GDM, pancreatitis, pancreatic surgery, severe hypoglycemia or DKA.  She takes 3 oral meds.  She says cbg varies from 125-191.   Past Medical History:  Diagnosis Date   Anemia    Anxiety    Anxiety    Cancer ([place])    basal cell of the face   Colon polyp    Diabetes mellitus without complication ([facility])    type 2   GERD (gastroesophageal reflux disease)    Hyperlipidemia    Hypertension    Obesity    Obesity    Osteoarthritis    hands -.Dr Fredna Dow   Osteoarthrosis, unspecified whether generalized or localized, hand    Vitamin D deficiency     Past Surgical History:  Procedure Laterality Date    breast biopsy     benign cyst - 1981   ABDOMINAL HYSTERECTOMY     APPENDECTOMY     BREAST SURGERY     KNEE SURGERY  2011   left -arthroscopy   TOTAL ABDOMINAL HYSTERECTOMY  1977   unilateral due to endometriosis   TOTAL KNEE ARTHROPLASTY  11/23/2012   Procedure: TOTAL KNEE ARTHROPLASTY;  Surgeon: Tobi Bastos, MD;  Location: [facility];  Service: Orthopedics;  Laterality: Left;    Social History   Socioeconomic History   Marital status: Widowed    Spouse name: Not on file   Number of children: Not on file   Years of education: Not on file   Highest education level: Not on file  Occupational History   Not on file  Tobacco Use   Smoking status: Never   Smokeless tobacco: Never  Vaping Use   Vaping Use: Never used  Substance and Sexual Activity   Alcohol use: No   Drug use: No   Sexual activity: Not on file  Other Topics Concern   Not on file  Social History Narrative   Not on file   Social Determinants of Health   Financial Resource Strain: Not on file   Food Insecurity: Not on file  Transportation Needs: Not on file  Physical Activity: Not on file  Stress: Not on file  Social Connections: Not on file  Intimate Partner Violence: Not on file    Current Outpatient Medications on File Prior to Visit  Medication Sig Dispense Refill   amLODipine (NORVASC) 5 MG tablet Take 5 mg by mouth daily before breakfast.     atorvastatin (LIPITOR) 80 MG tablet Take 80 mg by mouth at bedtime.     ferrous sulfate 325 (65 FE) MG tablet Take 325 mg by mouth daily with breakfast.     fish oil-omega-3 fatty acids 1000 MG capsule Take 1 g by mouth 3 (three) times daily.     glipiZIDE (GLUCOTROL XL) 10 MG 24 hr tablet Take 10 mg by mouth daily with breakfast.     Glucosamine-Chondroit-Vit C-Mn (GLUCOSAMINE 1500 COMPLEX PO) Take 1 tablet by mouth 3 (three) times daily.     lisinopril-hydrochlorothiazide (PRINZIDE,ZESTORETIC) 20-25 MG per tablet Take 1 tablet by mouth daily before breakfast.     metFORMIN (GLUCOPHAGE) 500 MG tablet Take 1,000 mg by mouth  2 (two) times daily with a meal.      Multiple Vitamin (MULTIVITAMIN WITH MINERALS) TABS Take 1 tablet by mouth daily.     naproxen sodium (ALEVE) 220 MG tablet Take 220 mg by mouth every 12 (twelve) hours as needed (arthritis pain).      omeprazole (PRILOSEC OTC) 20 MG tablet Take 20 mg by mouth daily.     ondansetron (ZOFRAN) 4 MG tablet Take 1 tablet (4 mg total) by mouth every 6 (six) hours as needed for nausea. 20 tablet 0   oseltamivir (TAMIFLU) 75 MG capsule Take 1 capsule (75 mg total) by mouth 2 (two) times daily. 8 capsule 0   venlafaxine XR (EFFEXOR-XR) 37.5 MG 24 hr capsule Take 37.5 mg by mouth daily with breakfast.   1   Vitamin D, Ergocalciferol, (DRISDOL) 50000 UNITS CAPS Take 50,000 Units by mouth every 7 (seven) days. On Wednesday     No current facility-administered medications on file prior to visit.    Allergies  Allergen Reactions   Morphine And Related Itching    Felt like bugs were  crawling on her   Oxycodone Other (See Comments)    Hallucinations    Family History  Problem Relation Age of Onset   Lung cancer Father    Hiatal hernia Mother    Hypertension Mother    Dementia Mother    Hypertension Brother    Diabetes Brother    Healthy Brother     BP 112/68   Pulse 69   Ht 5' 0.5" (1.537 m)   Wt 201 lb 9.6 oz (91.4 kg)   SpO2 94%   BMI 38.72 kg/m     Review of Systems denies weight loss, sob, n/v, memory loss, and depression.      Objective:   Physical Exam Pulses: dorsalis pedis intact bilat.   MSK: no deformity of the feet CV: trace bilat leg edema Skin:  no ulcer on the feet.  normal color and temp on the feet. Neuro: sensation is intact to touch on the feet.    outside test results are reviewed:  A1c=10.1%  I have reviewed outside records, and summarized: Pt was noted to have elevated A1c, and referred here.  She has also been followed for AR and shoulder pain     Assessment & Plan:  Type 2 DM: uncontrolled   Patient Instructions  good diet and exercise significantly improve the control of your diabetes.  please let me know if you wish to be referred to a dietician.  high blood sugar is very risky to your health.  you should see an eye doctor and dentist every year.  It is very important to get all recommended vaccinations.  Controlling your blood pressure and cholesterol drastically reduces the damage diabetes does to your body.  Those who smoke should quit.  Please discuss these with your doctor.  check your blood sugar once a day.  vary the time of day when you check, between before the 3 meals, and at bedtime.  also check if you have symptoms of your blood sugar being too high or too low.  please keep a record of the readings and bring it to your next appointment here (or you can bring the meter itself).  You can write it on any piece of paper.  please call us sooner if your blood sugar goes below 70, or if most of your readings are  over 200. I have sent a prescription to your pharmacy, to add "  Mounjaro."  This medication is increased every 4 weeks, until it is 15 mg per week.   Please continue the same other medications. Please come back for a follow-up appointment in 3 months.

## 2021-06-24 NOTE — Patient Instructions (Addendum)
good diet and exercise significantly improve the control of your diabetes.  please let me know if you wish to be referred to a dietician.  high blood sugar is very risky to your health.  you should see an eye doctor and dentist every year.  It is very important to get all recommended vaccinations.  Controlling your blood pressure and cholesterol drastically reduces the damage diabetes does to your body.  Those who smoke should quit.  Please discuss these with your doctor.  check your blood sugar once a day.  vary the time of day when you check, between before the 3 meals, and at bedtime.  also check if you have symptoms of your blood sugar being too high or too low.  please keep a record of the readings and bring it to your next appointment here (or you can bring the meter itself).  You can write it on any piece of paper.  please call us sooner if your blood sugar goes below 70, or if most of your readings are over 200. I have sent a prescription to your pharmacy, to add "Mounjaro."  This medication is increased every 4 weeks, until it is 15 mg per week.   Please continue the same other medications. Please come back for a follow-up appointment in 3 months.

## 2021-07-19 ENCOUNTER — Telehealth: Payer: Self-pay | Admitting: Endocrinology

## 2021-07-22 ENCOUNTER — Other Ambulatory Visit: Payer: Self-pay

## 2021-07-22 ENCOUNTER — Ambulatory Visit
Admission: RE | Admit: 2021-07-22 | Discharge: 2021-07-22 | Disposition: A | Discharge status: Home / Self Care | Payer: Medicare PPO | Source: Ambulatory Visit | Attending: Family Medicine | Admitting: Family Medicine

## 2021-07-22 DIAGNOSIS — Z1231 Encounter for screening mammogram for malignant neoplasm of breast: Secondary | ICD-10-CM | POA: Diagnosis not present

## 2021-07-22 NOTE — Telephone Encounter (Signed)
Should this dose increase?

## 2021-07-23 NOTE — Telephone Encounter (Signed)
Pt called to check the status of her refill. I informed pt we do have the request and are currently working on it. I did ask pt to give [place] about 48 hours to get this worked out for her.

## 2021-07-25 ENCOUNTER — Other Ambulatory Visit: Payer: Self-pay

## 2021-07-25 DIAGNOSIS — E119 Type 2 diabetes mellitus without complications: Secondary | ICD-10-CM

## 2021-07-25 MED ORDER — TIRZEPATIDE 2.5 MG/0.5ML ~~LOC~~ SOAJ: 2.5000 mg | SUBCUTANEOUS | 0 refills | Status: DC

## 2021-07-25 NOTE — Telephone Encounter (Signed)
Patient following up on refill of Mounjaro, [facility] advised her that they were waiting refill authorization from our office. Patient has taken her last injection this past Tuesday. Margaret Zhang

## 2021-07-28 ENCOUNTER — Other Ambulatory Visit: Payer: Self-pay | Admitting: Orthopedic Surgery

## 2021-07-28 DIAGNOSIS — D1721 Benign lipomatous neoplasm of skin and subcutaneous tissue of right arm: Secondary | ICD-10-CM | POA: Diagnosis not present

## 2021-08-05 ENCOUNTER — Telehealth: Payer: Self-pay | Admitting: Endocrinology

## 2021-08-05 MED ORDER — TIRZEPATIDE 5 MG/0.5ML ~~LOC~~ SOAJ
5.0000 mg | SUBCUTANEOUS | 3 refills | Status: DC
Start: 2021-08-05 — End: 2022-07-22

## 2021-08-05 NOTE — Telephone Encounter (Signed)
please contact patient: I have sent a prescription to your pharmacy, to double the mounjaro.  I'll see you next time.

## 2021-08-12 NOTE — Telephone Encounter (Signed)
Pt is wondering why this medication has increased. Pt would like a call back

## 2021-08-25 ENCOUNTER — Encounter: Payer: Self-pay | Admitting: Registered"

## 2021-08-25 ENCOUNTER — Other Ambulatory Visit: Payer: Self-pay

## 2021-08-25 ENCOUNTER — Encounter: Payer: Medicare PPO | Attending: Endocrinology | Admitting: Registered"

## 2021-08-25 DIAGNOSIS — E1165 Type 2 diabetes mellitus with hyperglycemia: Secondary | ICD-10-CM | POA: Diagnosis not present

## 2021-08-25 NOTE — Progress Notes (Signed)
Diabetes Self-Management Education  Visit Type: First/Initial  Appt. Start Time: 0940 Appt. End Time: 1055  08/25/2021  [name], identified by name and date of birth, is a 75 y.o. female with a diagnosis of Diabetes: Type 2.   ASSESSMENT  There were no vitals taken for this visit. There is no height or weight on file to calculate BMI.  A1c 10.7% Medications: In chart: Metformin 500 bid, glipizide 10 mg, Mounjaro 5 mg once a week. Pt states she is also taking Januvia 100 mg, but it is not listed in her medications.  Patient states her PCP referred her to an endocrinologist (Dr. Loanne Drilling) since her blood sugar was getting so high. Patient states she started [facility] about 4 weeks ago and has reduced appetite, eating less food and has lost 20 lbs. Pt states her side effected have been limited to nausea sometimes in the morning a couple of times in the evening.   Patient has been checking her fasting blood glucose and noticed it has been getting much lower. Based on patient reported blood glucose readings she has reduced her blood sugar and likely in the controlled range now.  Patient states when she cut out sodas some time ago she had lost a little weight then.    Pt reports that she loves to walk outside but not in the humidity. Pt reports she has a treadmill and she gets on it often. Pt reports that her family is being supportive and they are all making healthy changes together.   Diabetes Self-Management Education - 08/25/21 0949       Visit Information   Visit Type First/Initial      Initial Visit   Diabetes Type Type 2    Are you currently following a meal plan? No    Are you taking your medications as prescribed? Yes    Date Diagnosed 1990      Health Coping   How would you rate your overall health? Good      Psychosocial Assessment   Patient Belief/Attitude about Diabetes Motivated to manage diabetes    How often do you need to have someone help you when you  read instructions, pamphlets, or other written materials from your doctor or pharmacy? 1 - Never    What is the last grade level you completed in school? 12      Complications   Last HgB A1C per patient/outside source 10.7 %    How often do you check your blood sugar? 1-2 times/day    Fasting Blood glucose range (mg/dL) --   108-116   Have you had a dilated eye exam in the past 12 months? Yes    Have you had a dental exam in the past 12 months? Yes    Are you checking your feet? Yes    How many days per week are you checking your feet? 7      Dietary Intake   Breakfast breakfast essentials with 2% milk, 1 c coffee with french vanilla creamer    Lunch tuna or chicken sensations, egg, tomato, sometimes with crackers    Dinner grill chicken, vegetables, very little bread    Beverage(s) 6 glasses water, 1 coffee, unsweet tea, sprite zero 10 oz a few days a week, crystal light with water      Exercise   Exercise Type Light (walking / raking leaves)    How many days per week to you exercise? 4    How many minutes per  day do you exercise? 20    Total minutes per week of exercise 80      Patient Education   Previous Diabetes Education No    Nutrition management  Role of diet in the treatment of diabetes and the relationship between the three main macronutrients and blood glucose level;Food label reading, portion sizes and measuring food.;Carbohydrate counting    Physical activity and exercise  Role of exercise on diabetes management, blood pressure control and cardiac health.    Monitoring Identified appropriate SMBG and/or A1C goals.      Individualized Goals (developed by patient)   Nutrition General guidelines for healthy choices and portions discussed    Medications Other (comment)   check with MD regarding Januvia   Monitoring  test my blood glucose as discussed      Outcomes   Expected Outcomes Demonstrated interest in learning. Expect positive outcomes    Future DMSE 2 months     Program Status Not Completed             Individualized Plan for Diabetes Self-Management Training:   Learning Objective:  Patient will have a greater understanding of diabetes self-management. Patient education plan is to attend individual and/or group sessions per assessed needs and concerns.    Patient Instructions  Consider getting 100% whole wheat or whole grain bread instead of honey wheat Yogurt - look at the label. Yoplait the red package probably has too much added sugar Read food labels for total carbohydrates, protein and saturated fat. Use the yellow carb sheets to continue identifying carbohydrates and serving sizes. Consider checking your blood sugar at different times during the day - read through your notes from Dr. Loanne Drilling. Bring your blood sugar log to your next appointment  Contact Dr. Jason Nest office. Let them know that Dr Loanne Drilling started you on Mounjara. I believe this medication should replace the Januvia. Please call and verify.  Expected Outcomes:  Demonstrated interest in learning. Expect positive outcomes  Education material provided: Food label handouts, A1C conversion sheet, My Plate, and Carbohydrate counting sheet  If problems or questions, patient to contact team via:  Phone and MyChart  Future DSME appointment: 2 months

## 2021-08-25 NOTE — Patient Instructions (Addendum)
Consider getting 100% whole wheat or whole grain bread instead of honey wheat Yogurt - look at the label. Yoplait the red package probably has too much added sugar Read food labels for total carbohydrates, protein and saturated fat. Use the yellow carb sheets to continue identifying carbohydrates and serving sizes. Consider checking your blood sugar at different times during the day - read through your notes from Dr. Loanne Drilling. Bring your blood sugar log to your next appointment  Contact Dr. Jason Nest office. Let them know that Dr Loanne Drilling started you on Mounjara. I believe this medication should replace the Januvia. Please call and verify.

## 2021-08-29 ENCOUNTER — Telehealth: Payer: Self-pay | Admitting: Endocrinology

## 2021-08-29 ENCOUNTER — Other Ambulatory Visit: Payer: Self-pay | Admitting: Endocrinology

## 2021-08-29 MED ORDER — ONDANSETRON HCL 4 MG PO TABS: 4.0000 mg | ORAL_TABLET | Freq: Four times a day (QID) | ORAL | 0 refills | Status: DC | PRN

## 2021-08-29 NOTE — Telephone Encounter (Signed)
Please advise 

## 2021-08-29 NOTE — Telephone Encounter (Signed)
Patient called to advise that she had nausea and 2x vomiting events this week since increasing Mounjaro the as part of the titration plan.  Patient is [facility] an [facility] [address]  Call back number [phone]

## 2021-08-29 NOTE — Telephone Encounter (Signed)
Called and spoke with patient  she said that she isn't  able to take  half of the '5mg'$  because the pen is set at the  '5mg'$ , pt said that she willing stay on the 2 mg and take some for nausea for another week to see how thing go. She said if this doesn't help then pt will call back to change to the lower dose.

## 2021-09-25 ENCOUNTER — Ambulatory Visit (INDEPENDENT_AMBULATORY_CARE_PROVIDER_SITE_OTHER): Payer: Medicare PPO | Admitting: Endocrinology

## 2021-09-25 ENCOUNTER — Other Ambulatory Visit: Payer: Self-pay

## 2021-09-25 VITALS — BP 130/64 | HR 88 | Ht 60.0 in | Wt 182.6 lb

## 2021-09-25 DIAGNOSIS — E119 Type 2 diabetes mellitus without complications: Secondary | ICD-10-CM

## 2021-09-25 LAB — POCT GLYCOSYLATED HEMOGLOBIN (HGB A1C): Hemoglobin A1C: 7 % — AB (ref 4.0–5.6)

## 2021-09-25 MED ORDER — ONDANSETRON HCL 4 MG PO TABS: 4.0000 mg | ORAL_TABLET | Freq: Four times a day (QID) | ORAL | 2 refills | Status: DC | PRN

## 2021-09-25 MED ORDER — ONDANSETRON 4 MG PO TBDP
4.0000 mg | ORAL_TABLET | Freq: Three times a day (TID) | ORAL | 3 refills | Status: DC | PRN
Start: 2021-09-25 — End: 2022-01-16

## 2021-09-25 MED ORDER — METFORMIN HCL ER 500 MG PO TB24
2000.0000 mg | ORAL_TABLET | Freq: Every day | ORAL | 3 refills | Status: DC
Start: 2021-09-25 — End: 2022-09-21

## 2021-09-25 NOTE — Progress Notes (Signed)
Subjective:    Patient ID: Margaret Zhang, female    DOB: 12-04-1946, 75 y.o.   MRN: [id number]  HPI Pt returns for f/u of diabetes mellitus: DM type: 2 Dx'ed: 4431 Complications: none Therapy: Mounjaro and 2 oral meds GDM: never DKA: never Severe hypoglycemia: never Pancreatitis: never Pancreatic imaging: none known SDOH: none Other: she has never been on insulin Interval history: she brings a record of her cbg's which I have reviewed today.  Cbg varies from 80-179.  She takes zofran BIW for nausea.   Past Medical History:  Diagnosis Date   Anemia    Anxiety    Anxiety    Cancer ([place])    basal cell of the face   Colon polyp    Diabetes mellitus without complication (HCC)    type 2   GERD (gastroesophageal reflux disease)    Hyperlipidemia    Hypertension    Obesity    Obesity    Osteoarthritis    hands -.Dr Fredna Dow   Osteoarthrosis, unspecified whether generalized or localized, hand    Vitamin D deficiency     Past Surgical History:  Procedure Laterality Date    breast biopsy     benign cyst - 1981   ABDOMINAL HYSTERECTOMY     APPENDECTOMY     BREAST SURGERY     KNEE SURGERY  2011   left -arthroscopy   TOTAL ABDOMINAL HYSTERECTOMY  1977   unilateral due to endometriosis   TOTAL KNEE ARTHROPLASTY  11/23/2012   Procedure: TOTAL KNEE ARTHROPLASTY;  Surgeon: Tobi Bastos, MD;  Location: [facility];  Service: Orthopedics;  Laterality: Left;    Social History   Socioeconomic History   Marital status: Widowed    Spouse name: Not on file   Number of children: Not on file   Years of education: Not on file   Highest education level: Not on file  Occupational History   Not on file  Tobacco Use   Smoking status: Never   Smokeless tobacco: Never  Vaping Use   Vaping Use: Never used  Substance and Sexual Activity   Alcohol use: No   Drug use: No   Sexual activity: Not on file  Other Topics Concern   Not on file  Social History Narrative   Not on file    Social Determinants of Health   Financial Resource Strain: Not on file  Food Insecurity: Not on file  Transportation Needs: Not on file  Physical Activity: Not on file  Stress: Not on file  Social Connections: Not on file  Intimate Partner Violence: Not on file    Current Outpatient Medications on File Prior to Visit  Medication Sig Dispense Refill   amLODipine (NORVASC) 5 MG tablet Take 5 mg by mouth daily before breakfast.     atorvastatin (LIPITOR) 80 MG tablet Take 80 mg by mouth at bedtime.     ferrous sulfate 325 (65 FE) MG tablet Take 325 mg by mouth daily with breakfast.     fish oil-omega-3 fatty acids 1000 MG capsule Take 1 g by mouth 3 (three) times daily.     glipiZIDE (GLUCOTROL XL) 10 MG 24 hr tablet Take 10 mg by mouth daily with breakfast.     Glucosamine-Chondroit-Vit C-Mn (GLUCOSAMINE 1500 COMPLEX PO) Take 1 tablet by mouth 3 (three) times daily.     lisinopril-hydrochlorothiazide (PRINZIDE,ZESTORETIC) 20-25 MG per tablet Take 1 tablet by mouth daily before breakfast.     Multiple Vitamin (MULTIVITAMIN WITH  MINERALS) TABS Take 1 tablet by mouth daily.     naproxen sodium (ALEVE) 220 MG tablet Take 220 mg by mouth every 12 (twelve) hours as needed (arthritis pain).      omeprazole (PRILOSEC OTC) 20 MG tablet Take 20 mg by mouth daily.     oseltamivir (TAMIFLU) 75 MG capsule Take 1 capsule (75 mg total) by mouth 2 (two) times daily. 8 capsule 0   tirzepatide (MOUNJARO) 5 MG/0.5ML Pen Inject 5 mg into the skin once a week. 6 mL 3   venlafaxine XR (EFFEXOR-XR) 37.5 MG 24 hr capsule Take 37.5 mg by mouth daily with breakfast.   1   Vitamin D, Ergocalciferol, (DRISDOL) 50000 UNITS CAPS Take 50,000 Units by mouth every 7 (seven) days. On Wednesday     No current facility-administered medications on file prior to visit.    Allergies  Allergen Reactions   Morphine And Related Itching    Felt like bugs were crawling on her   Oxycodone Other (See Comments)     Hallucinations    Family History  Problem Relation Age of Onset   Lung cancer Father    Hiatal hernia Mother    Hypertension Mother    Dementia Mother    Hypertension Brother    Diabetes Brother    Healthy Brother     BP 130/64 (BP Location: Right Arm, Patient Position: Sitting, Cuff Size: Large)   Pulse 88   Ht 5' (1.524 m)   Wt 182 lb 9.6 oz (82.8 kg)   SpO2 96%   BMI 35.66 kg/m   Review of Systems She has lost 22 lbs since last ov.  She denies hypoglycemia.      Objective:   Physical Exam    Lab Results  Component Value Date   HGBA1C 7.0 (A) 09/25/2021      Assessment & Plan:  Type 2 DM: well-controlled.   Nausea, due to 2 meds.  I refilled Zofran, but we need to eliminate the need for this.    Patient Instructions  check your blood sugar once a day.  vary the time of day when you check, between before the 3 meals, and at bedtime.  also check if you have symptoms of your blood sugar being too high or too low.  please keep a record of the readings and bring it to your next appointment here (or you can bring the meter itself).  You can write it on any piece of paper.  please call us sooner if your blood sugar goes below 70, or if most of your readings are over 200. I have sent a prescription to your pharmacy, to change the metformin to extended-release. Please continue the same other 2 diabetes medications.  Please come back for a follow-up appointment in 2 months.

## 2021-09-25 NOTE — Patient Instructions (Addendum)
check your blood sugar once a day.  vary the time of day when you check, between before the 3 meals, and at bedtime.  also check if you have symptoms of your blood sugar being too high or too low.  please keep a record of the readings and bring it to your next appointment here (or you can bring the meter itself).  You can write it on any piece of paper.  please call us sooner if your blood sugar goes below 70, or if most of your readings are over 200. I have sent a prescription to your pharmacy, to change the metformin to extended-release. Please continue the same other 2 diabetes medications.  Please come back for a follow-up appointment in 2 months.

## 2021-11-10 ENCOUNTER — Other Ambulatory Visit: Payer: Self-pay

## 2021-11-10 ENCOUNTER — Encounter: Payer: Medicare PPO | Attending: Endocrinology | Admitting: Registered"

## 2021-11-10 DIAGNOSIS — E119 Type 2 diabetes mellitus without complications: Secondary | ICD-10-CM | POA: Insufficient documentation

## 2021-11-10 NOTE — Patient Instructions (Addendum)
Great job on the changes you have made that have really helped your A1c came down!  Talk to Dr. Arnoldo Lenis about how you are taking metformin check your blood sugar once a day and vary the time of day when you check options are: Fasting in the morning When you feel like it is low 1-2 hours after a meal (write down what you ate) At bedtime  Lunch Ideas: Tuna or Chicken with crackers, boiled if you didn't have for breakfast, raw broccoli, carrots, cauliflower Salad with protein Sandwich (buy just a few buns for the week), vegetable Leftovers  Keep a snack in your purse when you are out doing errands

## 2021-11-10 NOTE — Progress Notes (Signed)
Diabetes Self-Management Education  Visit Type: Follow-up  Appt. Start Time: 0930 Appt. End Time: 1010  11/10/2021  [name], identified by name and date of birth, is a 75 y.o. female with a diagnosis of Diabetes: Type 2.   ASSESSMENT  Wt Readings from Last 3 Encounters:  11/10/21 170 lb (patient reported)  09/25/21 182 lb 9.6 oz (82.8 kg)  06/24/21 201 lb 9.6 oz (91.4 kg)  12/10/18 195 lb 5.2 oz (88.6 kg)    A1c 7.0% 09/25/21 Medication:  Metformin XR, Rx 1000 bid, pt  taking differently - 500 mg/bid due to low blood sugar sxs. Mounjaro 5 mg/week SMBG: patient reported FBS 110- 113; random checks not much more than 120 mg/dL. Patient is not checking PPBG.  Hypoglycemic sxs at 86 mg/dL  Pt states prior to starting diabetes medications she weight about 200 lbs, weighed herself this morning and now is 170 lbs.  Pt states today she would like suggestions for lunch. Pt states she is satisfied with her breakfast choices, but because she lives alone doesn't want to cook lunch. Pt reports she stills has dinner with son & granddaughter. Pt states she tries to eat dinner by 6:30 and notices when she eats a late dinner her FBS is a little higher.  Pt states some foods upset her stomach including raw apples, and doesn't like pre-made salad because it may not be fresh.  Patient reports hypoglycemic sxs 2-3 x/month. Usually sxs of shaky and lightheaded when out shopping or inbetween lunch and dinner. Pt states less frequent since discontinuing glipizide. Pt reports she keeps candy with her when at church.   Diabetes Self-Management Education - 11/10/21 0941       Visit Information   Visit Type Follow-up      Initial Visit   Diabetes Type Type 2    Are you taking your medications as prescribed? Yes      Complications   How often do you check your blood sugar? 1-2 times/day    Fasting Blood glucose range (mg/dL) 70-129      Dietary Intake   Breakfast breakfast essentials  OR boiled egg    Lunch Salad or tuna or chicken sensations, egg, tomato, sometimes with crackers    Dinner meat, vegetables    Beverage(s) water      Patient Education   Medications Reviewed patients medication for diabetes, action, purpose, timing of dose and side effects.    Monitoring Purpose and frequency of SMBG.    Acute complications Other (comment)   avoiding hypoglycemic sxs     Individualized Goals (developed by patient)   Nutrition General guidelines for healthy choices and portions discussed    Monitoring  test my blood glucose as discussed    Reducing Risk Other (comment)   snacks to avoid hypglycemia     Patient Self-Evaluation of Goals - Patient rates self as meeting previously set goals (% of time)   Nutrition >75%    Medications >75%    Monitoring 50 - 75 %      Outcomes   Expected Outcomes Demonstrated interest in learning. Expect positive outcomes    Future DMSE 2 months    Program Status Not Completed      Subsequent Visit   Since your last visit have you continued or begun to take your medications as prescribed? Yes    Since your last visit, are you checking your blood glucose at least once a day? Yes  Individualized Plan for Diabetes Self-Management Training:   Learning Objective:  Patient will have a greater understanding of diabetes self-management. Patient education plan is to attend individual and/or group sessions per assessed needs and concerns.   Patient Instructions  Doristine Devoid job on the changes you have made that have really helped your A1c came down!  Talk to Dr. Arnoldo Lenis about how you are taking metformin check your blood sugar once a day and vary the time of day when you check options are: Fasting in the morning When you feel like it is low 1-2 hours after a meal (write down what you ate) At bedtime  Lunch Ideas: Tuna or Chicken with crackers, boiled if you didn't have for breakfast, raw broccoli, carrots, cauliflower Salad  with protein Sandwich (buy just a few buns for the week), vegetable Leftovers  Keep a snack in your purse when you are out doing errands Expected Outcomes:  Demonstrated interest in learning. Expect positive outcomes  Education material provided: none  If problems or questions, patient to contact team via:  Phone and MyChart  Future DSME appointment: 2 months

## 2021-11-25 ENCOUNTER — Ambulatory Visit (INDEPENDENT_AMBULATORY_CARE_PROVIDER_SITE_OTHER): Payer: Medicare PPO | Admitting: Endocrinology

## 2021-11-25 ENCOUNTER — Other Ambulatory Visit: Payer: Self-pay

## 2021-11-25 VITALS — HR 67 | Ht 60.0 in | Wt 171.4 lb

## 2021-11-25 DIAGNOSIS — E119 Type 2 diabetes mellitus without complications: Secondary | ICD-10-CM

## 2021-11-25 LAB — POCT GLYCOSYLATED HEMOGLOBIN (HGB A1C): Hemoglobin A1C: 7 % — AB (ref 4.0–5.6)

## 2021-11-25 MED ORDER — GLIPIZIDE 5 MG PO TABS
5.0000 mg | ORAL_TABLET | Freq: Every day | ORAL | 3 refills | Status: DC
Start: 2021-11-25 — End: 2022-01-16

## 2021-11-25 NOTE — Progress Notes (Signed)
Subjective:    Patient ID: Margaret Zhang, female    DOB: 1946/06/15, 75 y.o.   MRN: [id number]  HPI Pt returns for f/u of diabetes mellitus: DM type: 2 Dx'ed: 8768 Complications: none Therapy: Mounjaro and 2 oral meds GDM: never DKA: never Severe hypoglycemia: never Pancreatitis: never Pancreatic imaging: none known SDOH: none Other: she has never been on insulin Interval history: she brings a record of her cbg's which I have reviewed today.  CBG varies from 96-129.  She takes zofran for nausea approx twice per month.   Past Medical History:  Diagnosis Date   Anemia    Anxiety    Anxiety    Cancer ([place])    basal cell of the face   Colon polyp    Diabetes mellitus without complication (HCC)    type 2   GERD (gastroesophageal reflux disease)    Hyperlipidemia    Hypertension    Obesity    Obesity    Osteoarthritis    hands -.Dr Fredna Dow   Osteoarthrosis, unspecified whether generalized or localized, hand    Vitamin D deficiency     Past Surgical History:  Procedure Laterality Date    breast biopsy     benign cyst - 1981   ABDOMINAL HYSTERECTOMY     APPENDECTOMY     BREAST SURGERY     KNEE SURGERY  2011   left -arthroscopy   TOTAL ABDOMINAL HYSTERECTOMY  1977   unilateral due to endometriosis   TOTAL KNEE ARTHROPLASTY  11/23/2012   Procedure: TOTAL KNEE ARTHROPLASTY;  Surgeon: Tobi Bastos, MD;  Location: [facility];  Service: Orthopedics;  Laterality: Left;    Social History   Socioeconomic History   Marital status: Widowed    Spouse name: Not on file   Number of children: Not on file   Years of education: Not on file   Highest education level: Not on file  Occupational History   Not on file  Tobacco Use   Smoking status: Never   Smokeless tobacco: Never  Vaping Use   Vaping Use: Never used  Substance and Sexual Activity   Alcohol use: No   Drug use: No   Sexual activity: Not on file  Other Topics Concern   Not on file  Social History  Narrative   Not on file   Social Determinants of Health   Financial Resource Strain: Not on file  Food Insecurity: Not on file  Transportation Needs: Not on file  Physical Activity: Not on file  Stress: Not on file  Social Connections: Not on file  Intimate Partner Violence: Not on file    Current Outpatient Medications on File Prior to Visit  Medication Sig Dispense Refill   amLODipine (NORVASC) 5 MG tablet Take 5 mg by mouth daily before breakfast.     atorvastatin (LIPITOR) 80 MG tablet Take 80 mg by mouth at bedtime.     ferrous sulfate 325 (65 FE) MG tablet Take 325 mg by mouth daily with breakfast.     fish oil-omega-3 fatty acids 1000 MG capsule Take 1 g by mouth 3 (three) times daily.     Glucosamine-Chondroit-Vit C-Mn (GLUCOSAMINE 1500 COMPLEX PO) Take 1 tablet by mouth 3 (three) times daily.     lisinopril-hydrochlorothiazide (PRINZIDE,ZESTORETIC) 20-25 MG per tablet Take 1 tablet by mouth daily before breakfast.     metFORMIN (GLUCOPHAGE-XR) 500 MG 24 hr tablet Take 4 tablets (2,000 mg total) by mouth daily. (Patient taking differently: Take 500  mg by mouth 2 (two) times daily.) 360 tablet 3   Multiple Vitamin (MULTIVITAMIN WITH MINERALS) TABS Take 1 tablet by mouth daily.     naproxen sodium (ALEVE) 220 MG tablet Take 220 mg by mouth every 12 (twelve) hours as needed (arthritis pain).      omeprazole (PRILOSEC OTC) 20 MG tablet Take 20 mg by mouth daily.     ondansetron (ZOFRAN ODT) 4 MG disintegrating tablet Take 1 tablet (4 mg total) by mouth every 8 (eight) hours as needed for nausea or vomiting. 30 tablet 3   ondansetron (ZOFRAN) 4 MG tablet Take 1 tablet (4 mg total) by mouth every 6 (six) hours as needed for nausea. 30 tablet 2   oseltamivir (TAMIFLU) 75 MG capsule Take 1 capsule (75 mg total) by mouth 2 (two) times daily. 8 capsule 0   tirzepatide (MOUNJARO) 5 MG/0.5ML Pen Inject 5 mg into the skin once a week. 6 mL 3   venlafaxine XR (EFFEXOR-XR) 37.5 MG 24 hr  capsule Take 37.5 mg by mouth daily with breakfast.   1   Vitamin D, Ergocalciferol, (DRISDOL) 50000 UNITS CAPS Take 50,000 Units by mouth every 7 (seven) days. On Wednesday     No current facility-administered medications on file prior to visit.    Allergies  Allergen Reactions   Morphine And Related Itching    Felt like bugs were crawling on her   Oxycodone Other (See Comments)    Hallucinations    Family History  Problem Relation Age of Onset   Lung cancer Father    Hiatal hernia Mother    Hypertension Mother    Dementia Mother    Hypertension Brother    Diabetes Brother    Healthy Brother     Pulse 67   Ht 5' (1.524 m)   Wt 171 lb 6.4 oz (77.7 kg)   SpO2 97%   BMI 33.47 kg/m    Review of Systems     Objective:   Physical Exam  Lab Results  Component Value Date   CREATININE 0.77 12/10/2018   BUN 14 12/10/2018   NA 139 12/10/2018   K 3.5 12/10/2018   CL 102 12/10/2018   CO2 23 12/10/2018    A1c=7.0%    Assessment & Plan:  Type 2 DM: overcontrolled, for this SU-containing regimen Hypoglycemia, due to mounjaro: we discussed.  She chooses to continue  Patient Instructions  check your blood sugar once a day.  vary the time of day when you check, between before the 3 meals, and at bedtime.  also check if you have symptoms of your blood sugar being too high or too low.  please keep a record of the readings and bring it to your next appointment here (or you can bring the meter itself).  You can write it on any piece of paper.  please call us sooner if your blood sugar goes below 70, or if most of your readings are over 200. I have sent a prescription to your pharmacy, to reduce the glipizide.  Please continue the same other meds Please come back for a follow-up appointment in 3 months.

## 2021-11-25 NOTE — Patient Instructions (Addendum)
check your blood sugar once a day.  vary the time of day when you check, between before the 3 meals, and at bedtime.  also check if you have symptoms of your blood sugar being too high or too low.  please keep a record of the readings and bring it to your next appointment here (or you can bring the meter itself).  You can write it on any piece of paper.  please call us sooner if your blood sugar goes below 70, or if most of your readings are over 200. I have sent a prescription to your pharmacy, to reduce the glipizide.  Please continue the same other meds Please come back for a follow-up appointment in 3 months.

## 2021-12-11 DIAGNOSIS — E119 Type 2 diabetes mellitus without complications: Secondary | ICD-10-CM | POA: Diagnosis not present

## 2021-12-11 DIAGNOSIS — R011 Cardiac murmur, unspecified: Secondary | ICD-10-CM | POA: Diagnosis not present

## 2021-12-11 DIAGNOSIS — I10 Essential (primary) hypertension: Secondary | ICD-10-CM | POA: Diagnosis not present

## 2022-01-12 ENCOUNTER — Ambulatory Visit: Payer: Medicare PPO | Admitting: Registered"

## 2022-01-16 ENCOUNTER — Other Ambulatory Visit: Payer: Self-pay

## 2022-01-16 ENCOUNTER — Ambulatory Visit: Payer: Medicare PPO | Admitting: Interventional Cardiology

## 2022-01-16 ENCOUNTER — Encounter: Payer: Self-pay | Admitting: Interventional Cardiology

## 2022-01-16 VITALS — BP 110/62 | HR 95 | Ht 60.0 in | Wt 162.0 lb

## 2022-01-16 DIAGNOSIS — R011 Cardiac murmur, unspecified: Secondary | ICD-10-CM

## 2022-01-16 DIAGNOSIS — E1159 Type 2 diabetes mellitus with other circulatory complications: Secondary | ICD-10-CM

## 2022-01-16 DIAGNOSIS — E782 Mixed hyperlipidemia: Secondary | ICD-10-CM

## 2022-01-16 DIAGNOSIS — I10 Essential (primary) hypertension: Secondary | ICD-10-CM

## 2022-01-16 MED ORDER — ASPIRIN EC 81 MG PO TBEC: 81.0000 mg | DELAYED_RELEASE_TABLET | Freq: Every day | ORAL | 3 refills | Status: AC

## 2022-01-16 NOTE — Patient Instructions (Addendum)
Medication Instructions:  Your physician has recommended you make the following change in your medication: Start Aspirin 81 mg by mouth daily  *If you need a refill on your cardiac medications before your next appointment, please call your pharmacy*   Lab Work: none If you have labs (blood work) drawn today and your tests are completely normal, you will receive your results only by: [facility] (if you have MyChart) OR A paper copy in the mail If you have any lab test that is abnormal or we need to change your treatment, we will call you to review the results.   Testing/Procedures: Your physician has requested that you have an echocardiogram. Echocardiography is a painless test that uses sound waves to create images of your heart. It provides your doctor with information about the size and shape of your heart and how well your hearts chambers and valves are working. This procedure takes approximately one hour. There are no restrictions for this procedure.    Follow-Up: At [facility], you and your health needs are our priority.  As part of our continuing mission to provide you with exceptional heart care, we have created designated Provider Care Teams.  These Care Teams include your primary Cardiologist (physician) and Advanced Practice Providers (APPs -  Physician Assistants and Nurse Practitioners) who all work together to provide you with the care you need, when you need it.  We recommend signing up for the patient portal called "MyChart".  Sign up information is provided on this After Visit Summary.  MyChart is used to connect with patients for Virtual Visits (Telemedicine).  Patients are able to view lab/test results, encounter notes, upcoming appointments, etc.  Non-urgent messages can be sent to your provider as well.   To learn more about what you can do with MyChart, go to [email or web address].    Your next appointment:   12 month(s)  The format for your next  appointment:   In Person  Provider:   Dr Irish Lack If MD is not listed, click here to update    :1}    Other Instructions

## 2022-01-16 NOTE — Progress Notes (Signed)
Cardiology Office Note   Date:  01/16/2022   ID:  Margaret Zhang, DOB 19-Sep-1946, MRN [id number]  PCP:  Maurice Small, MD    Chief Complaint  Patient presents with   Follow-up   Heart murmur  Wt Readings from Last 3 Encounters:  01/16/22 162 lb (73.5 kg)  11/25/21 171 lb 6.4 oz (77.7 kg)  09/25/21 182 lb 9.6 oz (82.8 kg)       History of Present Illness: Margaret Zhang is a 76 y.o. female   who presents for murmur.    She has a hx of diabetes and HTN.  She was seen in 2015 for abnormal Echo and with dyspnea and chest tightness.   The prevouis echo with EF 60% and G1DD.  Trivial MR.   Exercise myoview with EF 73%, LV wall motion, nl LV function, NL wall motion.      2018 echo: EF 55-60%, normal WM, G1DD. Trivial AR, LA was moderately dilated.  Trivial MR. Similar to previous Echo.  Aortic sclerosis noted.  No aortic stenosis.  Risk factors for CAD including diabetes and hyperlipidemia.  Mounjaro helped with weight loss and decreased A1C.  She does chair exercises and Tai Chi.  She walks at the gym.  She walks outside when the weather is good.   Denies : Chest pain. Dizziness. Leg edema. Nitroglycerin use. Orthopnea. Palpitations. Paroxysmal nocturnal dyspnea. Shortness of breath. Syncope.    Brother had a hole in heart, had a surgery to correct.  Past Medical History:  Diagnosis Date   Anemia    Anxiety    Balance problems    Basal cell carcinoma    Colon polyp    DEF    basal cell of the face   Diabetes mellitus without complication (HCC)    type 2   GERD (gastroesophageal reflux disease)    Hearing loss    Heart murmur    Hyperlipidemia    Hypertension    Iron deficiency anemia    Obesity    Osteoarthritis    hands -.Dr Fredna Dow   Osteoarthrosis, unspecified whether generalized or localized, hand    Pure hypercholesterolemia    Vitamin D deficiency     Past Surgical History:  Procedure Laterality Date    breast biopsy     benign cyst - 1981    ABDOMINAL HYSTERECTOMY     APPENDECTOMY     BREAST SURGERY     KNEE SURGERY  2011   left -arthroscopy   TOTAL ABDOMINAL HYSTERECTOMY  1977   unilateral due to endometriosis   TOTAL KNEE ARTHROPLASTY  11/23/2012   Procedure: TOTAL KNEE ARTHROPLASTY;  Surgeon: Tobi Bastos, MD;  Location: [facility];  Service: Orthopedics;  Laterality: Left;     Current Outpatient Medications  Medication Sig Dispense Refill   amLODipine (NORVASC) 5 MG tablet Take 5 mg by mouth daily before breakfast.     atorvastatin (LIPITOR) 80 MG tablet Take 80 mg by mouth at bedtime.     lisinopril-hydrochlorothiazide (ZESTORETIC) 20-12.5 MG tablet Take 1 tablet by mouth daily before breakfast.     metFORMIN (GLUCOPHAGE-XR) 500 MG 24 hr tablet Take 4 tablets (2,000 mg total) by mouth daily. (Patient taking differently: Take 500 mg by mouth 2 (two) times daily.) 360 tablet 3   Multiple Vitamin (MULTIVITAMIN WITH MINERALS) TABS Take 1 tablet by mouth daily.     naproxen sodium (ALEVE) 220 MG tablet Take 220 mg by mouth every  12 (twelve) hours as needed (arthritis pain).      omeprazole (PRILOSEC OTC) 20 MG tablet Take 20 mg by mouth daily.     ondansetron (ZOFRAN) 4 MG tablet Take 1 tablet (4 mg total) by mouth every 6 (six) hours as needed for nausea. 30 tablet 2   tirzepatide (MOUNJARO) 5 MG/0.5ML Pen Inject 5 mg into the skin once a week. 6 mL 3   Vitamin D, Ergocalciferol, (DRISDOL) 50000 UNITS CAPS Take 50,000 Units by mouth every 7 (seven) days. On Wednesday     No current facility-administered medications for this visit.    Allergies:   Oxycodone and Morphine and related    Social History:  The patient  reports that she has never smoked. She has never used smokeless tobacco. She reports that she does not drink alcohol and does not use drugs.   Family History:  The patient's family history includes Dementia in her mother; Diabetes in her brother; Healthy in her brother; Hiatal hernia in her mother; Hypertension  in her brother and mother; Lung cancer in her father.    ROS:  Please see the history of present illness.   Otherwise, review of systems are positive for intentional weight loss with Mounjaro.   All other systems are reviewed and negative.    PHYSICAL EXAM: VS:  BP 110/62    Pulse 95    Ht 5' (1.524 m)    Wt 162 lb (73.5 kg)    SpO2 97%    BMI 31.64 kg/m  , BMI Body mass index is 31.64 kg/m. GEN: Well nourished, well developed, in no acute distress HEENT: normal Neck: no JVD, carotid bruits, or masses Cardiac: RRR; 3/6 early systolic murmur, no rubs, or gallops,no edema  Respiratory:  clear to auscultation bilaterally, normal work of breathing GI: soft, nontender, nondistended, + BS MS: no deformity or atrophy Skin: warm and dry, no rash Neuro:  Strength and sensation are intact Psych: euthymic mood, full affect   EKG:   The ekg ordered today demonstrates NSR, nonspecific ST changes   Recent Labs: No results found for requested labs within last 8760 hours.   Lipid Panel No results found for: CHOL, TRIG, HDL, CHOLHDL, VLDL, LDLCALC, LDLDIRECT   Other studies Reviewed: Additional studies/ records that were reviewed today with results demonstrating: labs reviewed.   ASSESSMENT AND PLAN:  Aortic sclerosis: Systolic murmur on exam. Check echo.  DM: improved on Mounjaro.  Whole food, plant based diet.  Hyperlipidemia: LDL 96 on high dose atorvastatin.  HTN: The current medical regimen is effective;  continue present plan and medications.  Whole food, plant-based diet.  High-fiber diet.  Avoid processed foods. Healthy lifestyle to manage CAD RF.  Start baby aspirin daily. Watch for bleeding.    Current medicines are reviewed at length with the patient today.  The patient concerns regarding her medicines were addressed.  The following changes have been made:  No change  Labs/ tests ordered today include:  No orders of the defined types were placed in this  encounter.   Recommend 150 minutes/week of aerobic exercise Low fat, low carb, high fiber diet recommended  Disposition:   FU in 1 year   Signed, Larae Grooms, MD  01/16/2022 2:08 PM    [facility] [address], [place], [place]  [address] Phone: [phone]; Fax: [phone]

## 2022-01-30 ENCOUNTER — Other Ambulatory Visit: Payer: Self-pay

## 2022-01-30 ENCOUNTER — Ambulatory Visit (HOSPITAL_COMMUNITY): Payer: Medicare PPO | Attending: Interventional Cardiology

## 2022-01-30 DIAGNOSIS — R011 Cardiac murmur, unspecified: Secondary | ICD-10-CM | POA: Diagnosis not present

## 2022-01-30 LAB — ECHOCARDIOGRAM COMPLETE
AR max vel: 1.54 cm2
AV Area VTI: 1.32 cm2
AV Area mean vel: 1.41 cm2
AV Mean grad: 10 mmHg
AV Peak grad: 18.5 mmHg
Ao pk vel: 2.15 m/s
Area-P 1/2: 2.79 cm2
S' Lateral: 3.1 cm

## 2022-02-16 ENCOUNTER — Encounter: Payer: Self-pay | Admitting: Registered"

## 2022-02-16 ENCOUNTER — Encounter: Payer: Medicare PPO | Attending: Endocrinology | Admitting: Registered"

## 2022-02-16 ENCOUNTER — Other Ambulatory Visit: Payer: Self-pay

## 2022-02-16 VITALS — Wt 162.0 lb

## 2022-02-16 DIAGNOSIS — E119 Type 2 diabetes mellitus without complications: Secondary | ICD-10-CM | POA: Insufficient documentation

## 2022-02-16 NOTE — Patient Instructions (Addendum)
Great job on keeping up with keeping up with all your health care  Continue getting fresh vegetables and not letting the vegetables sit around too long and rinsing off fresh produce.  Continue checking your blood sugar as directed by MD Check at different times of the day fasting and 1-2 hours after meals  Continue going to the [facility] for exercise and other activities

## 2022-02-16 NOTE — Progress Notes (Signed)
Diabetes Self-Management Education  Visit Type: Follow-up  Appt. Start Time: 0808 Appt. End Time: 0845  02/16/22  [name], identified by name and date of birth, is a 76 y.o. female with a diagnosis of Diabetes: Type 2.   ASSESSMENT  Wt Readings from Last 3 Encounters:  01/16/22 162 lb (73.5 kg)  11/25/21 171 lb 6.4 oz (77.7 kg)  09/25/21 182 lb 9.6 oz (82.8 kg)     A1c 7.0% 09/25/21 will be having it updated soon Medication:  Metformin XR, 500 mg/bid due to low blood sugar sxs. Mounjaro 5 mg/week SMBG: patient reported FBS & PPBG 88-135 mg/dL Pt states low blood sugar sxs have pretty much resolved   Pt states Mounjaro has reduced appetite to the point she has to remind herself to eat. Pt states she does not want to loose too much weight, another 10 lbs would be okay, but feels like much more than that would be too much weight loss.  Physical activity: [facility] [employer] group and gym, walk 30 min 3 x week. If weather is bad does chair exercises.  Pt states she likes to buy locally grown vegetables. Pt states she rinses off all vegetables, fresh, frozen or canned.  Pt states she is active in church with choir and recently started going to the [employer] in [place].    Diabetes Self-Management Education - 02/16/22 0816       Visit Information   Visit Type Follow-up      Complications   How often do you check your blood sugar? 3-4 times / week    Number of hypoglycemic episodes per month 0    Number of hyperglycemic episodes per week 0    Have you had a dilated eye exam in the past 12 months? Yes      Dietary Intake   Breakfast whole grain cereal, fruit    Lunch salmon, a little slaw, vegetables    Beverage(s) water, coffee, unsweet tea      Patient Education   Physical activity and exercise  Role of exercise on diabetes management, blood pressure control and cardiac health.    Monitoring Purpose and frequency of SMBG.      Individualized Goals  (developed by patient)   Physical Activity Exercise 3-5 times per week    Medications Other (comment)   talk to Endocrinologist about concern of supply chain for meds     Outcomes   Expected Outcomes Demonstrated interest in learning. Expect positive outcomes    Future DMSE PRN    Program Status Completed      Subsequent Visit   Since your last visit have you continued or begun to take your medications as prescribed? Yes    Since your last visit have you experienced any weight changes? No change    Since your last visit, are you checking your blood glucose at least once a day? Yes             Individualized Plan for Diabetes Self-Management Training:   Learning Objective:  Patient will have a greater understanding of diabetes self-management. Patient education plan is to attend individual and/or group sessions per assessed needs and concerns.   Patient Instructions  Doristine Devoid job on keeping up with keeping up with all your health care  Continue getting fresh vegetables and not letting the vegetables sit around too long and rinsing off fresh produce.  Continue checking your blood sugar as directed by MD Check at different times of the  day fasting and 1-2 hours after meals  Continue going to the [facility] [employer] for exercise and other activities   Expected Outcomes:  Demonstrated interest in learning. Expect positive outcomes  Education material provided: none  If problems or questions, patient to contact team via:  Phone and MyChart  Future DSME appointment: PRN

## 2022-02-22 ENCOUNTER — Other Ambulatory Visit: Payer: Self-pay | Admitting: Endocrinology

## 2022-02-24 ENCOUNTER — Ambulatory Visit: Payer: Medicare PPO | Admitting: Endocrinology

## 2022-03-02 ENCOUNTER — Ambulatory Visit (INDEPENDENT_AMBULATORY_CARE_PROVIDER_SITE_OTHER): Payer: Medicare PPO | Admitting: Endocrinology

## 2022-03-02 ENCOUNTER — Encounter: Payer: Self-pay | Admitting: Endocrinology

## 2022-03-02 ENCOUNTER — Other Ambulatory Visit: Payer: Self-pay

## 2022-03-02 VITALS — BP 116/74 | HR 74 | Ht 60.0 in | Wt 158.6 lb

## 2022-03-02 DIAGNOSIS — E119 Type 2 diabetes mellitus without complications: Secondary | ICD-10-CM | POA: Diagnosis not present

## 2022-03-02 LAB — POCT GLYCOSYLATED HEMOGLOBIN (HGB A1C): Hemoglobin A1C: 6.6 % — AB (ref 4.0–5.6)

## 2022-03-02 NOTE — Progress Notes (Signed)
? ?Subjective:  ? ? Patient ID: Margaret Zhang, female    DOB: 04/01/1946, 76 y.o.   MRN: [id number] ? ?HPI ?Pt returns for f/u of diabetes mellitus: ?DM type: 2 ?Dx'ed: 2002 ?Complications: none ?Therapy: Mounjaro and metformin.   ?GDM: never ?DKA: never ?Severe hypoglycemia: never ?Pancreatitis: never ?Pancreatic imaging: none known.   ?SDOH: none ?Other: she has never been on insulin.   ?Interval history: she brings a record of her cbg's which I have reviewed today.  CBG varies from 88-128.  he stopped glipizide approx 6 weeks ago.  Nausea is just intermittent.   ?Past Medical History:  ?Diagnosis Date  ? Anemia   ? Anxiety   ? Balance problems   ? Basal cell carcinoma   ? Colon polyp   ? DEF   ? basal cell of the face  ? Diabetes mellitus without complication ([place])   ? type 2  ? GERD (gastroesophageal reflux disease)   ? Hearing loss   ? Heart murmur   ? Hyperlipidemia   ? Hypertension   ? Iron deficiency anemia   ? Obesity   ? Osteoarthritis   ? hands -.Dr Fredna Dow  ? Osteoarthrosis, unspecified whether generalized or localized, hand   ? Pure hypercholesterolemia   ? Vitamin D deficiency   ? ? ?Past Surgical History:  ?Procedure Laterality Date  ?  breast biopsy    ? benign cyst - 1981  ? ABDOMINAL HYSTERECTOMY    ? APPENDECTOMY    ? BREAST SURGERY    ? KNEE SURGERY  2011  ? left -arthroscopy  ? TOTAL ABDOMINAL HYSTERECTOMY  1977  ? unilateral due to endometriosis  ? TOTAL KNEE ARTHROPLASTY  11/23/2012  ? Procedure: TOTAL KNEE ARTHROPLASTY;  Surgeon: Tobi Bastos, MD;  Location: [facility];  Service: Orthopedics;  Laterality: Left;  ? ? ?Social History  ? ?Socioeconomic History  ? Marital status: Widowed  ?  Spouse name: Not on file  ? Number of children: Not on file  ? Years of education: Not on file  ? Highest education level: Not on file  ?Occupational History  ? Not on file  ?Tobacco Use  ? Smoking status: Never  ? Smokeless tobacco: Never  ?Vaping Use  ? Vaping Use: Never used  ?Substance and Sexual  Activity  ? Alcohol use: No  ? Drug use: No  ? Sexual activity: Not on file  ?Other Topics Concern  ? Not on file  ?Social History Narrative  ? Not on file  ? ?Social Determinants of Health  ? ?Financial Resource Strain: Not on file  ?Food Insecurity: Not on file  ?Transportation Needs: Not on file  ?Physical Activity: Not on file  ?Stress: Not on file  ?Social Connections: Not on file  ?Intimate Partner Violence: Not on file  ? ? ?Current Outpatient Medications on File Prior to Visit  ?Medication Sig Dispense Refill  ? amLODipine (NORVASC) 5 MG tablet Take 5 mg by mouth daily before breakfast.    ? aspirin EC 81 MG tablet Take 1 tablet (81 mg total) by mouth daily. Swallow whole. 90 tablet 3  ? atorvastatin (LIPITOR) 80 MG tablet Take 80 mg by mouth at bedtime.    ? lisinopril-hydrochlorothiazide (ZESTORETIC) 20-12.5 MG tablet Take 1 tablet by mouth daily before breakfast.    ? metFORMIN (GLUCOPHAGE-XR) 500 MG 24 hr tablet Take 4 tablets (2,000 mg total) by mouth daily. (Patient taking differently: Take 500 mg by mouth 2 (two) times daily.) 360  tablet 3  ? Multiple Vitamin (MULTIVITAMIN WITH MINERALS) TABS Take 1 tablet by mouth daily.    ? naproxen sodium (ALEVE) 220 MG tablet Take 220 mg by mouth every 12 (twelve) hours as needed (arthritis pain).     ? omeprazole (PRILOSEC OTC) 20 MG tablet Take 20 mg by mouth daily.    ? ondansetron (ZOFRAN) 4 MG tablet TAKE 1 TABLET BY MOUTH EVERY 6 HOURS AS NEEDED FOR NAUSEA 30 tablet 2  ? tirzepatide (MOUNJARO) 5 MG/0.5ML Pen Inject 5 mg into the skin once a week. 6 mL 3  ? Vitamin D, Ergocalciferol, (DRISDOL) 50000 UNITS CAPS Take 50,000 Units by mouth every 7 (seven) days. On Wednesday    ? ?No current facility-administered medications on file prior to visit.  ? ? ?Allergies  ?Allergen Reactions  ? Oxycodone Other (See Comments)  ?  Hallucinations  ? Morphine And Related Itching  ?  Felt like bugs were crawling on her  ? ? ?Family History  ?Problem Relation Age of Onset  ?  Lung cancer Father   ? Hiatal hernia Mother   ? Hypertension Mother   ? Dementia Mother   ? Hypertension Brother   ? Diabetes Brother   ? Healthy Brother   ? ? ?BP 116/74 (BP Location: Left Arm, Patient Position: Sitting, Cuff Size: Normal)   Pulse 74   Ht 5' (1.524 m)   Wt 158 lb 9.6 oz (71.9 kg)   SpO2 99%   BMI 30.97 kg/m?  ? ? ?Review of Systems ?She has lost 13 lbs since last ov here.   ?   ?Objective:  ? Physical Exam ?VITAL SIGNS:  See vs page.    ?GENERAL: no distress.   ? ? ? ?Lab Results  ?Component Value Date  ? HGBA1C 6.6 (A) 03/02/2022  ? ?   ?Assessment & Plan:  ?Type 2 DM: well-controlled ?Nausea, due to [facility]: we discussed.  We decide to continue the same for now. ? ?Patient Instructions  ?check your blood sugar once a day.  vary the time of day when you check, between before the 3 meals, and at bedtime.  also check if you have symptoms of your blood sugar being too high or too low.  please keep a record of the readings and bring it to your next appointment here (or you can bring the meter itself).  You can write it on any piece of paper.  please call us sooner if your blood sugar goes below 70, or if most of your readings are over 200.   ?Please continue the same 2 diabetes medications.   ?Please come back for a follow-up appointment in May.    ? ? ?

## 2022-03-02 NOTE — Patient Instructions (Addendum)
check your blood sugar once a day.  vary the time of day when you check, between before the 3 meals, and at bedtime.  also check if you have symptoms of your blood sugar being too high or too low.  please keep a record of the readings and bring it to your next appointment here (or you can bring the meter itself).  You can write it on any piece of paper.  please call us sooner if your blood sugar goes below 70, or if most of your readings are over 200.   ?Please continue the same 2 diabetes medications.   ?Please come back for a follow-up appointment in May.    ?

## 2022-05-18 ENCOUNTER — Ambulatory Visit: Payer: Medicare PPO | Admitting: Endocrinology

## 2022-06-01 DIAGNOSIS — E119 Type 2 diabetes mellitus without complications: Secondary | ICD-10-CM | POA: Diagnosis not present

## 2022-06-01 DIAGNOSIS — H02834 Dermatochalasis of left upper eyelid: Secondary | ICD-10-CM | POA: Diagnosis not present

## 2022-06-01 DIAGNOSIS — H02831 Dermatochalasis of right upper eyelid: Secondary | ICD-10-CM | POA: Diagnosis not present

## 2022-06-01 DIAGNOSIS — Z961 Presence of intraocular lens: Secondary | ICD-10-CM | POA: Diagnosis not present

## 2022-06-01 DIAGNOSIS — H43813 Vitreous degeneration, bilateral: Secondary | ICD-10-CM | POA: Diagnosis not present

## 2022-06-08 ENCOUNTER — Other Ambulatory Visit: Payer: Self-pay | Admitting: Family Medicine

## 2022-06-08 DIAGNOSIS — Z1231 Encounter for screening mammogram for malignant neoplasm of breast: Secondary | ICD-10-CM

## 2022-06-18 ENCOUNTER — Other Ambulatory Visit: Payer: Self-pay

## 2022-06-18 DIAGNOSIS — E119 Type 2 diabetes mellitus without complications: Secondary | ICD-10-CM

## 2022-06-18 MED ORDER — ONDANSETRON HCL 4 MG PO TABS: 4.0000 mg | ORAL_TABLET | Freq: Four times a day (QID) | ORAL | 2 refills | Status: DC | PRN

## 2022-07-20 ENCOUNTER — Ambulatory Visit
Admission: RE | Admit: 2022-07-20 | Discharge: 2022-07-20 | Disposition: A | Discharge status: Home / Self Care | Payer: Medicare PPO | Source: Ambulatory Visit | Attending: Family Medicine | Admitting: Family Medicine

## 2022-07-20 DIAGNOSIS — Z1231 Encounter for screening mammogram for malignant neoplasm of breast: Secondary | ICD-10-CM

## 2022-07-21 ENCOUNTER — Other Ambulatory Visit: Payer: Self-pay | Admitting: Internal Medicine

## 2022-07-21 ENCOUNTER — Telehealth: Payer: Self-pay | Admitting: Internal Medicine

## 2022-07-21 DIAGNOSIS — E119 Type 2 diabetes mellitus without complications: Secondary | ICD-10-CM

## 2022-07-21 NOTE — Telephone Encounter (Signed)
MEDICATION: Mounjaro  PHARMACY:  [facility] on [address]   HAS THE PATIENT CONTACTED THEIR PHARMACY?  yes  IS THIS A 90 DAY SUPPLY : unknown  IS PATIENT OUT OF MEDICATION: yes  IF NOT; HOW MUCH IS LEFT:   LAST APPOINTMENT DATE: '@6'$ /22/2023  NEXT APPOINTMENT DATE:'@9'$ /25/2023  DO WE HAVE YOUR PERMISSION TO LEAVE A DETAILED MESSAGE?: yes  OTHER COMMENTS: [facility] patient - patient called them and needs refill   **Let patient know to contact pharmacy at the end of the day to make sure medication is ready. **  ** Please notify patient to allow 48-72 hours to process**  **Encourage patient to contact the pharmacy for refills or they can request refills through [facility]**

## 2022-07-22 MED ORDER — MOUNJARO 5 MG/0.5ML ~~LOC~~ SOAJ: SUBCUTANEOUS | 0 refills | Status: DC

## 2022-07-24 DIAGNOSIS — Z0000 Encounter for general adult medical examination without abnormal findings: Secondary | ICD-10-CM | POA: Diagnosis not present

## 2022-07-24 DIAGNOSIS — Z23 Encounter for immunization: Secondary | ICD-10-CM | POA: Diagnosis not present

## 2022-07-24 DIAGNOSIS — R011 Cardiac murmur, unspecified: Secondary | ICD-10-CM | POA: Diagnosis not present

## 2022-07-24 DIAGNOSIS — E7800 Pure hypercholesterolemia, unspecified: Secondary | ICD-10-CM | POA: Diagnosis not present

## 2022-07-24 DIAGNOSIS — E2839 Other primary ovarian failure: Secondary | ICD-10-CM | POA: Diagnosis not present

## 2022-07-24 DIAGNOSIS — E1165 Type 2 diabetes mellitus with hyperglycemia: Secondary | ICD-10-CM | POA: Diagnosis not present

## 2022-07-24 DIAGNOSIS — I10 Essential (primary) hypertension: Secondary | ICD-10-CM | POA: Diagnosis not present

## 2022-07-27 ENCOUNTER — Ambulatory Visit
Admission: RE | Admit: 2022-07-27 | Discharge: 2022-07-27 | Disposition: A | Discharge status: Home / Self Care | Payer: Medicare PPO | Source: Ambulatory Visit | Attending: Family Medicine | Admitting: Family Medicine

## 2022-07-27 DIAGNOSIS — Z1231 Encounter for screening mammogram for malignant neoplasm of breast: Secondary | ICD-10-CM | POA: Diagnosis not present

## 2022-07-28 ENCOUNTER — Other Ambulatory Visit: Payer: Self-pay | Admitting: Family Medicine

## 2022-07-28 DIAGNOSIS — E2839 Other primary ovarian failure: Secondary | ICD-10-CM

## 2022-08-20 DIAGNOSIS — R051 Acute cough: Secondary | ICD-10-CM | POA: Diagnosis not present

## 2022-08-20 DIAGNOSIS — J029 Acute pharyngitis, unspecified: Secondary | ICD-10-CM | POA: Diagnosis not present

## 2022-09-21 ENCOUNTER — Encounter: Payer: Self-pay | Admitting: Internal Medicine

## 2022-09-21 ENCOUNTER — Ambulatory Visit: Payer: Medicare PPO | Admitting: Internal Medicine

## 2022-09-21 VITALS — BP 132/70 | HR 83 | Ht 60.0 in | Wt 147.4 lb

## 2022-09-21 DIAGNOSIS — E119 Type 2 diabetes mellitus without complications: Secondary | ICD-10-CM

## 2022-09-21 DIAGNOSIS — E1165 Type 2 diabetes mellitus with hyperglycemia: Secondary | ICD-10-CM | POA: Diagnosis not present

## 2022-09-21 MED ORDER — METFORMIN HCL ER 500 MG PO TB24: 1000.0000 mg | ORAL_TABLET | Freq: Two times a day (BID) | ORAL | 3 refills | Status: DC

## 2022-09-21 MED ORDER — MOUNJARO 5 MG/0.5ML ~~LOC~~ SOAJ: SUBCUTANEOUS | 3 refills | Status: DC

## 2022-09-21 NOTE — Patient Instructions (Signed)
Please continue: - Metformin ER 1000 mg 2x a day, with meals - Mounjaro 5 mg weekly   Please return in 4-6 months.  PATIENT INSTRUCTIONS FOR TYPE 2 DIABETES:  **Please join MyChart!** - see attached instructions about how to join if you have not done so already.  DIET AND EXERCISE Diet and exercise is an important part of diabetic treatment.  We recommended aerobic exercise in the form of brisk walking (working between 40-60% of maximal aerobic capacity, similar to brisk walking) for 150 minutes per week (such as 30 minutes five days per week) along with 3 times per week performing 'resistance' training (using various gauge rubber tubes with handles) 5-10 exercises involving the major muscle groups (upper body, lower body and core) performing 10-15 repetitions (or near fatigue) each exercise. Start at half the above goal but build slowly to reach the above goals. If limited by weight, joint pain, or disability, we recommend daily walking in a swimming pool with water up to waist to reduce pressure from joints while allow for adequate exercise.    BLOOD GLUCOSES Monitoring your blood glucoses is important for continued management of your diabetes. Please check your blood glucoses 2-4 times a day: fasting, before meals and at bedtime (you can rotate these measurements - e.g. one day check before the 3 meals, the next day check before 2 of the meals and before bedtime, etc.).   HYPOGLYCEMIA (low blood sugar) Hypoglycemia is usually a reaction to not eating, exercising, or taking too much insulin/ other diabetes drugs.  Symptoms include tremors, sweating, hunger, confusion, headache, etc. Treat IMMEDIATELY with 15 grams of Carbs: 4 glucose tablets  cup regular juice/soda 2 tablespoons raisins 4 teaspoons sugar 1 tablespoon honey Recheck blood glucose in 15 mins and repeat above if still symptomatic/blood glucose <100.  RECOMMENDATIONS TO REDUCE YOUR RISK OF DIABETIC COMPLICATIONS: * Take  your prescribed MEDICATION(S) * Follow a DIABETIC diet: Complex carbs, fiber rich foods, (monounsaturated and polyunsaturated) fats * AVOID saturated/trans fats, high fat foods, >2,300 mg salt per day. * EXERCISE at least 5 times a week for 30 minutes or preferably daily.  * DO NOT SMOKE OR DRINK more than 1 drink a day. * Check your FEET every day. Do not wear tightfitting shoes. Contact us if you develop an ulcer * See your EYE doctor once a year or more if needed * Get a FLU shot once a year * Get a PNEUMONIA vaccine once before and once after age 1 years  GOALS:  * Your Hemoglobin A1c of <7%  * fasting sugars need to be 80-130 * after meals sugars need to be <180 (2h after you start eating) * Your Systolic BP should be 701 or lower  * Your Diastolic BP should be 80 or lower  * Your HDL (Good Cholesterol) should be 40 or higher  * Your LDL (Bad Cholesterol) should be ideally <70. * Your Triglycerides should be 150 or lower  * Your Urine microalbumin (kidney function) should be <30 * Your Body Mass Index should be 25 or lower   Please consider the following ways to cut down carbs and fat and increase fiber and micronutrients in your diet: - substitute whole grain for white bread or pasta - substitute brown rice for white rice - substitute 90-calorie flat bread pieces for slices of bread when possible - substitute sweet potatoes or yams for white potatoes - substitute humus for margarine - substitute tofu for cheese when possible - substitute almond or  rice milk for regular milk (would not drink soy milk daily due to concern for soy estrogen influence on breast cancer risk) - substitute dark chocolate for other sweets when possible - substitute water - can add lemon or orange slices for taste - for diet sodas (artificial sweeteners will trick your body that you can eat sweets without getting calories and will lead you to overeating and weight gain in the long run) - do not skip  breakfast or other meals (this will slow down the metabolism and will result in more weight gain over time)  - can try smoothies made from fruit and almond/rice milk in am instead of regular breakfast - can also try old-fashioned (not instant) oatmeal made with almond/rice milk in am - order the dressing on the side when eating salad at a restaurant (pour less than half of the dressing on the salad) - eat as little meat as possible - can try juicing, but should not forget that juicing will get rid of the fiber, so would alternate with eating raw veg./fruits or drinking smoothies - use as little oil as possible, even when using olive oil - can dress a salad with a mix of balsamic vinegar and lemon juice, for e.g. - use agave nectar, stevia sugar, or regular sugar rather than artificial sweateners - steam or broil/roast veggies  - snack on veggies/fruit/nuts (unsalted, preferably) when possible, rather than processed foods - reduce or eliminate aspartame in diet (it is in diet sodas, chewing gum, etc) Read the labels!  Try to read Dr. Janene Harvey book: "Program for Reversing Diabetes" for other ideas for healthy eating.

## 2022-09-21 NOTE — Progress Notes (Signed)
Patient ID: Margaret Zhang, female   DOB: December 25, 1946, 76 y.o.   MRN: [id number]  HPI: Margaret Zhang is a 76 y.o.-year-old female, returning for follow-up for DM2, dx in 2002, non-insulin-dependent, controlled, without long-term complications. Pt. previously saw Dr. Loanne Drilling, last visit 6 months ago. She recently established care with Windy Carina, FNP, after Dr. Maurice Small left.  Reviewed HbA1c: 07/24/2022: HbA1c 6.1% Lab Results  Component Value Date   HGBA1C 6.6 (A) 03/02/2022   HGBA1C 7.0 (A) 11/25/2021   HGBA1C 7.0 (A) 09/25/2021   Pt is on a regimen of: - Metformin ER 1000 mg 2x a day, with meals - Mounjaro 5 mg weekly - started 06/2021 Previously on sulfonylurea (glipizide). Previously on Januvia.  Pt checks her sugars  1x a day and they are: - am: 89-123 - 2h after b'fast: 113 - before lunch: n/c - 2h after lunch: 120s - before dinner: n/c - 2h after dinner: n/c - bedtime: n/c - nighttime: n/c Lowest sugar was 89.  Highest sugar was 128.  Glucometer:TruMetrix  - no CKD, last BUN/creatinine:  07/24/2022: 19/0.62, GFR 91, ACR <20.6 Lab Results  Component Value Date   BUN 14 12/10/2018   BUN 10 11/26/2012   CREATININE 0.77 12/10/2018   CREATININE 0.56 11/26/2012  On lisinopril 20 mg daily.  - + HL; last set of lipids: 07/24/2022: 150/75/57/78 No results found for: "CHOL", "HDL", "LDLCALC", "LDLDIRECT", "TRIG", "CHOLHDL" On Lipitor 80 mg daily.  - last eye exam was on 06/01/2022. No DR reportedly. Dr. Katy Fitch. She is seen every 6 mo. Coming up 11/2022. Had cataracts sxs.  - no numbness and tingling in her feet. Last foot exam 06/2022 - PCP.  She also has a history of HTN, iron deficiency anemia, GERD, osteoarthritis, anxiety, and vitamin D deficiency.  ROS: + see HPI No increased urination, blurry vision, nausea, chest pain.  Past Medical History:  Diagnosis Date   Anemia    Anxiety    Balance problems    Basal cell carcinoma    Colon polyp     DEF    basal cell of the face   Diabetes mellitus without complication (HCC)    type 2   GERD (gastroesophageal reflux disease)    Hearing loss    Heart murmur    Hyperlipidemia    Hypertension    Iron deficiency anemia    Obesity    Osteoarthritis    hands -.Dr Fredna Dow   Osteoarthrosis, unspecified whether generalized or localized, hand    Pure hypercholesterolemia    Vitamin D deficiency    Past Surgical History:  Procedure Laterality Date    breast biopsy     benign cyst - 1981   ABDOMINAL HYSTERECTOMY     APPENDECTOMY     BREAST SURGERY     KNEE SURGERY  2011   left -arthroscopy   TOTAL ABDOMINAL HYSTERECTOMY  1977   unilateral due to endometriosis   TOTAL KNEE ARTHROPLASTY  11/23/2012   Procedure: TOTAL KNEE ARTHROPLASTY;  Surgeon: Tobi Bastos, MD;  Location: [facility];  Service: Orthopedics;  Laterality: Left;   Social History   Socioeconomic History   Marital status: Widowed    Spouse name: Not on file   Number of children: Not on file   Years of education: Not on file   Highest education level: Not on file  Occupational History   Not on file  Tobacco Use   Smoking status: Never   Smokeless tobacco: Never  Vaping Use   Vaping Use: Never used  Substance and Sexual Activity   Alcohol use: No   Drug use: No   Sexual activity: Not on file  Other Topics Concern   Not on file  Social History Narrative   Not on file   Social Determinants of Health   Financial Resource Strain: Not on file  Food Insecurity: Not on file  Transportation Needs: Not on file  Physical Activity: Not on file  Stress: Not on file  Social Connections: Not on file  Intimate Partner Violence: Not on file   Current Outpatient Medications on File Prior to Visit  Medication Sig Dispense Refill   amLODipine (NORVASC) 5 MG tablet Take 5 mg by mouth daily before breakfast.     aspirin EC 81 MG tablet Take 1 tablet (81 mg total) by mouth daily. Swallow whole. 90 tablet 3    atorvastatin (LIPITOR) 80 MG tablet Take 80 mg by mouth at bedtime.     lisinopril-hydrochlorothiazide (ZESTORETIC) 20-12.5 MG tablet Take 1 tablet by mouth daily before breakfast.     metFORMIN (GLUCOPHAGE-XR) 500 MG 24 hr tablet Take 4 tablets (2,000 mg total) by mouth daily. (Patient taking differently: Take 500 mg by mouth 2 (two) times daily.) 360 tablet 3   Multiple Vitamin (MULTIVITAMIN WITH MINERALS) TABS Take 1 tablet by mouth daily.     naproxen sodium (ALEVE) 220 MG tablet Take 220 mg by mouth every 12 (twelve) hours as needed (arthritis pain).      omeprazole (PRILOSEC OTC) 20 MG tablet Take 20 mg by mouth daily.     ondansetron (ZOFRAN) 4 MG tablet Take 1 tablet (4 mg total) by mouth every 6 (six) hours as needed. for nausea 30 tablet 2   tirzepatide (MOUNJARO) 5 MG/0.5ML Pen INJECT 5 MG INTO THE SKIN ONCE A WEEK 6 mL 0   Vitamin D, Ergocalciferol, (DRISDOL) 50000 UNITS CAPS Take 50,000 Units by mouth every 7 (seven) days. On Wednesday     No current facility-administered medications on file prior to visit.   Allergies  Allergen Reactions   Oxycodone Other (See Comments)    Hallucinations   Morphine And Related Itching    Felt like bugs were crawling on her   Family History  Problem Relation Age of Onset   Lung cancer Father    Hiatal hernia Mother    Hypertension Mother    Dementia Mother    Hypertension Brother    Diabetes Brother    Healthy Brother    PE: BP 132/70 (BP Location: Left Arm, Patient Position: Sitting, Cuff Size: Normal)   Pulse 83   Ht 5' (1.524 m)   Wt 147 lb 6.4 oz (66.9 kg)   SpO2 97%   BMI 28.79 kg/m  Wt Readings from Last 3 Encounters:  09/21/22 147 lb 6.4 oz (66.9 kg)  03/02/22 158 lb 9.6 oz (71.9 kg)  02/16/22 162 lb (73.5 kg)   Constitutional: normal weight, in NAD Eyes: no exophthalmos ENT: moist mucous membranes, no thyromegaly, no cervical lymphadenopathy Cardiovascular: RRR, + 1/6 SEM no RG Respiratory: CTA B Musculoskeletal: no  deformities Skin: moist, warm, no rashes Neurological: no tremor with outstretched hands Diabetic Foot Exam - Simple   Simple Foot Form Diabetic Foot exam was performed with the following findings: Yes 09/21/2022 10:08 AM  Visual Inspection No deformities, no ulcerations, no other skin breakdown bilaterally: Yes Sensation Testing Intact to touch and monofilament testing bilaterally: Yes Pulse Check Posterior Tibialis and Dorsalis pulse  intact bilaterally: Yes Comments    ASSESSMENT: 1. DM2, non-insulin-dependent, controlled, without long-term complications, but with hyperglycemia  2. HL  PLAN:  1. Patient with long-standing, controlled diabetes, on oral antidiabetic regimen with metformin, and also GLP/GIP receptor agonist, with good control-latest HbA1c was 6.6%, decreased from 7.0% at last visit with Dr. Loanne Drilling. Today, HbA1c is better: 6.2%. -At today's visit, sugars appear to be all at goal.  She tolerates Mounjaro well, and this is affordable for her.  She estimates that she lost approximately 60 pounds after starting Mounjaro.  She feels well and would very much like to continue it.  She also continues on metformin, tolerated well.  I refilled these for her today - I suggested to:  Patient Instructions  Please continue: - Metformin ER 1000 mg 2x a day, with meals - Mounjaro 5 mg weekly   Please return in 4-6 months.  - check sugars at different times of the day - check 1x a day, rotating checks - discussed about CBG targets for treatment: 80-130 mg/dL before meals and <180 mg/dL after meals; target HbA1c <7%. - given foot care handout  - given instructions for hypoglycemia management "15-15 rule"  - advised for yearly eye exams  - Return to clinic in 4 mo   2. HL - Reviewed latest lipid panel from last OV with PCP - at goal now - Continues Lipitor 80 mg daily without side effects.  Philemon Kingdom, MD PhD [facility]

## 2022-09-28 ENCOUNTER — Other Ambulatory Visit: Payer: Self-pay

## 2022-09-28 DIAGNOSIS — E119 Type 2 diabetes mellitus without complications: Secondary | ICD-10-CM

## 2022-09-28 MED ORDER — ONDANSETRON HCL 4 MG PO TABS: 4.0000 mg | ORAL_TABLET | Freq: Four times a day (QID) | ORAL | 2 refills | Status: AC | PRN

## 2022-11-11 DIAGNOSIS — Z23 Encounter for immunization: Secondary | ICD-10-CM | POA: Diagnosis not present

## 2023-01-13 ENCOUNTER — Ambulatory Visit
Admission: RE | Admit: 2023-01-13 | Discharge: 2023-01-13 | Disposition: A | Discharge status: Home / Self Care | Payer: Medicare PPO | Source: Ambulatory Visit | Attending: Family Medicine | Admitting: Family Medicine

## 2023-01-13 DIAGNOSIS — E2839 Other primary ovarian failure: Secondary | ICD-10-CM

## 2023-01-13 DIAGNOSIS — M85832 Other specified disorders of bone density and structure, left forearm: Secondary | ICD-10-CM | POA: Diagnosis not present

## 2023-01-13 DIAGNOSIS — Z780 Asymptomatic menopausal state: Secondary | ICD-10-CM | POA: Diagnosis not present

## 2023-02-19 DIAGNOSIS — R051 Acute cough: Secondary | ICD-10-CM | POA: Diagnosis not present

## 2023-02-19 DIAGNOSIS — E1165 Type 2 diabetes mellitus with hyperglycemia: Secondary | ICD-10-CM | POA: Diagnosis not present

## 2023-02-19 DIAGNOSIS — U071 COVID-19: Secondary | ICD-10-CM | POA: Diagnosis not present

## 2023-02-23 ENCOUNTER — Ambulatory Visit: Payer: Medicare PPO | Admitting: Internal Medicine

## 2023-04-06 ENCOUNTER — Ambulatory Visit: Payer: Medicare PPO | Admitting: Internal Medicine

## 2023-04-06 ENCOUNTER — Encounter: Payer: Self-pay | Admitting: Internal Medicine

## 2023-04-06 VITALS — BP 130/82 | HR 81 | Ht 60.0 in | Wt 151.4 lb

## 2023-04-06 DIAGNOSIS — E1165 Type 2 diabetes mellitus with hyperglycemia: Secondary | ICD-10-CM | POA: Diagnosis not present

## 2023-04-06 LAB — POCT GLYCOSYLATED HEMOGLOBIN (HGB A1C): Hemoglobin A1C: 6.1 % — AB (ref 4.0–5.6)

## 2023-04-06 NOTE — Progress Notes (Signed)
Patient ID: Margaret Zhang, female   DOB: [birth year], 77 y.o.   MRN: [id number]  HPI: Margaret Zhang is a 77 y.o.-year-old female, returning for follow-up for DM2, dx in 2002, non-insulin-dependent, controlled, without long-term complications., returning for follow-up for DM2, dx in 2002, non-insulin-dependent, controlled, without long-term complications. Pt. previously saw Dr. Everardo All, last visit 6 months ago. She recently established care with Jorge Ny, FNP, after Dr. Shirlean Mylar left.  Interim history: No increased urination, blurry vision, chest pain.  However, she has occasional nausea morning before lunch.  She has Zofran at home to take as needed.  Reviewed HbA1c: 07/24/2022: HbA1c 6.1% Lab Results  Component Value Date   HGBA1C 6.6 (A) 03/02/2022   HGBA1C 7.0 (A) 11/25/2021   HGBA1C 7.0 (A) 09/25/2021   Pt is on a regimen of: - Metformin ER 1000 mg 2x a day, with meals - Mounjaro 5 mg weekly - started 06/2021 Previously on sulfonylurea (glipizide). Previously on Januvia.  Pt checks her sugars  1x a day and they are: - am: 89-123 >> 78-119 - 2h after b'fast: 113 >> n/c - before lunch: n/c - 2h after lunch: 120s >> n/c - before dinner: n/c - 2h after dinner: n/c - bedtime: n/c - nighttime: n/c >> 84 Lowest sugar was 89 >> 78.  Highest sugar was 128 >> 119.  Glucometer:TruMetrix  - no CKD, last BUN/creatinine:  07/24/2022: 19/0.62, GFR 91, ACR <20.6 Lab Results  Component Value Date   BUN 14 12/10/2018   BUN 10 11/26/2012   CREATININE 0.77 12/10/2018   CREATININE 0.56 11/26/2012  On lisinopril 20 mg daily.  - + HL; last set of lipids: 07/24/2022: 150/75/57/78 No results found for: "CHOL", "HDL", "LDLCALC", "LDLDIRECT", "TRIG", "CHOLHDL" On Lipitor 80 mg daily.  - last eye exam was on 06/01/2022. No DR reportedly. Dr. Dione Booze. She is seen every 6 mo.  Had cataracts sxs.  - no numbness and tingling in her feet. Last foot exam 09/21/2022.  She also has a history of HTN, iron deficiency anemia, GERD, osteoarthritis, anxiety, and vitamin D deficiency.  ROS: + see  HPI  Past Medical History:  Diagnosis Date   Anemia    Anxiety    Balance problems    Basal cell carcinoma    Colon polyp    DEF    basal cell of the face   Diabetes mellitus without complication (HCC)    type 2   GERD (gastroesophageal reflux disease)    Hearing loss    Heart murmur    Hyperlipidemia    Hypertension    Iron deficiency anemia    Obesity    Osteoarthritis    hands -.Dr Merlyn Lot   Osteoarthrosis, unspecified whether generalized or localized, hand    Pure hypercholesterolemia    Vitamin D deficiency    Past Surgical History:  Procedure Laterality Date    breast biopsy     benign cyst - 1981   ABDOMINAL HYSTERECTOMY     APPENDECTOMY     BREAST SURGERY     KNEE SURGERY  2011   left -arthroscopy   TOTAL ABDOMINAL HYSTERECTOMY  1977   unilateral due to endometriosis   TOTAL KNEE ARTHROPLASTY  11/23/2012   Procedure: TOTAL KNEE ARTHROPLASTY;  Surgeon: Jacki Cones, MD;  Location: [facility];  Service: Orthopedics;  Laterality: Left;   Social History   Socioeconomic History   Marital status: Widowed    Spouse name: Not on file   Number of children: Not on file   Years of education: Not on file  Highest education level: Not on file  Occupational History   Not on file  Tobacco Use   Smoking status: Never   Smokeless tobacco: Never  Vaping Use   Vaping Use: Never used  Substance and Sexual Activity   Alcohol use: No   Drug use: No   Sexual activity: Not on file  Other Topics Concern   Not on file  Social History Narrative   Not on file   Social Determinants of Health   Financial Resource Strain: Not on file  Food Insecurity: Not on file  Transportation Needs: Not on file  Physical Activity: Not on file  Stress: Not on file  Social Connections: Not on file  Intimate Partner Violence: Not on file   Current Outpatient Medications on File Prior to Visit  Medication Sig Dispense Refill   amLODipine (NORVASC) 5 MG tablet Take 5 mg by mouth  daily before breakfast.     aspirin EC 81 MG tablet Take 1 tablet (81 mg total) by mouth daily. Swallow whole. 90 tablet 3   atorvastatin (LIPITOR) 80 MG tablet Take 80 mg by mouth at bedtime.     lisinopril-hydrochlorothiazide (ZESTORETIC) 20-12.5 MG tablet Take 1 tablet by mouth daily before breakfast.     metFORMIN (GLUCOPHAGE-XR) 500 MG 24 hr tablet Take 2 tablets (1,000 mg total) by mouth 2 (two) times daily. 2 tablets in the morning , 2 tablets at bedtime 360 tablet 3   Multiple Vitamin (MULTIVITAMIN WITH MINERALS) TABS Take 1 tablet by mouth daily.     naproxen sodium (ALEVE) 220 MG tablet Take 220 mg by mouth every 12 (twelve) hours as needed (arthritis pain).      omeprazole (PRILOSEC OTC) 20 MG tablet Take 20 mg by mouth daily.     ondansetron (ZOFRAN) 4 MG tablet Take 1 tablet (4 mg total) by mouth every 6 (six) hours as needed. for nausea 30 tablet 2   tirzepatide (MOUNJARO) 5 MG/0.5ML Pen INJECT 5 MG INTO THE SKIN ONCE A WEEK 6 mL 3   Vitamin D, Ergocalciferol, (DRISDOL) 50000 UNITS CAPS Take 50,000 Units by mouth every 7 (seven) days. On Wednesday     No current facility-administered medications on file prior to visit.   Allergies  Allergen Reactions   Oxycodone Other (See Comments)    Hallucinations   Morphine And Related Itching    Felt like bugs were crawling on her   Family History  Problem Relation Age of Onset   Lung cancer Father    Hiatal hernia Mother    Hypertension Mother    Dementia Mother    Hypertension Brother    Diabetes Brother    Healthy Brother    PE: BP 130/82 (BP Location: Left Arm, Patient Position: Sitting, Cuff Size: Normal)   Pulse 81   Ht 5' (1.524 m)   Wt 151 lb 6.4 oz (68.7 kg)   SpO2 95%   BMI 29.57 kg/m  Wt Readings from Last 3 Encounters:  04/06/23 151 lb 6.4 oz (68.7 kg)  09/21/22 147 lb 6.4 oz (66.9 kg)  03/02/22 158 lb 9.6 oz (71.9 kg)   Constitutional: normal weight, in NAD Eyes: no exophthalmos ENT: no thyromegaly, no  cervical lymphadenopathy Cardiovascular: RRR, + 2/6 SEM no RG Respiratory: CTA B Musculoskeletal: no deformities Skin: no rashes Neurological: no tremor with outstretched hands  ASSESSMENT: 1. DM2, non-insulin-dependent, controlled, without long-term complications, but with hyperglycemia  2. HL  PLAN:  1. Patient with longstanding, uncontrolled, type 2 diabetes, on  oral antidiabetic regimen with metformin and also weekly GLP-1/GIP receptor agonist, with good control.  At last visit, HbA1c was 6.2%, slightly increased from 6.1% previously.  She was tolerating metformin and Mounjaro well.  She lost approximately 60 pounds after starting Mounjaro.  Sugars were all at goal.  We did not change her regimen. -At today's visit, sugars appear to be excellent.  However, she describes nausea occasionally in the morning or before lunch.  We discussed that both metformin and Mounjaro could cause this.  To identify which one is the culprit, I advised her to skip metformin at night for approximately 3 days to see if she still develops nausea in the morning.  I also advised her to skip metformin occasionally before going to church to see if that improves her nausea during the service.  She will experiment with this but if metformin is not the culprit, we may need to reduce the Mounjaro dose.  She will let me know. - I suggested to:  Patient Instructions  Please continue: - Metformin ER 1000 mg 2x a day, with meals - Mounjaro 5 mg weekly   Please return in 6 months.  - we checked her HbA1c: 6.1% (stable) - advised to check sugars at different times of the day - 1x a day, rotating check times - advised for yearly eye exams >> she is UTD - return to clinic in 6 months  2. HL -Reviewed latest lipid panel from last visit with PCP from 06/2022-at goal -Continues Lipitor 80 mg daily without side effects  Carlus Pavlov, MD PhD [facility]

## 2023-04-06 NOTE — Patient Instructions (Signed)
Please continue: - Metformin ER 1000 mg 2x a day, with meals - Mounjaro 5 mg weekly   Please return in 6 months.

## 2023-05-04 DIAGNOSIS — Z6830 Body mass index (BMI) 30.0-30.9, adult: Secondary | ICD-10-CM | POA: Diagnosis not present

## 2023-05-04 DIAGNOSIS — L247 Irritant contact dermatitis due to plants, except food: Secondary | ICD-10-CM | POA: Diagnosis not present

## 2023-05-06 DIAGNOSIS — L237 Allergic contact dermatitis due to plants, except food: Secondary | ICD-10-CM | POA: Diagnosis not present

## 2023-05-06 DIAGNOSIS — E1165 Type 2 diabetes mellitus with hyperglycemia: Secondary | ICD-10-CM | POA: Diagnosis not present

## 2023-07-05 ENCOUNTER — Other Ambulatory Visit: Payer: Self-pay | Admitting: Family Medicine

## 2023-07-05 DIAGNOSIS — Z1231 Encounter for screening mammogram for malignant neoplasm of breast: Secondary | ICD-10-CM

## 2023-07-29 DIAGNOSIS — Z9181 History of falling: Secondary | ICD-10-CM | POA: Diagnosis not present

## 2023-07-29 DIAGNOSIS — Z0000 Encounter for general adult medical examination without abnormal findings: Secondary | ICD-10-CM | POA: Diagnosis not present

## 2023-07-29 DIAGNOSIS — I10 Essential (primary) hypertension: Secondary | ICD-10-CM | POA: Diagnosis not present

## 2023-07-29 DIAGNOSIS — I700 Atherosclerosis of aorta: Secondary | ICD-10-CM | POA: Diagnosis not present

## 2023-07-29 DIAGNOSIS — H9190 Unspecified hearing loss, unspecified ear: Secondary | ICD-10-CM | POA: Diagnosis not present

## 2023-07-29 DIAGNOSIS — E119 Type 2 diabetes mellitus without complications: Secondary | ICD-10-CM | POA: Diagnosis not present

## 2023-07-29 DIAGNOSIS — E7800 Pure hypercholesterolemia, unspecified: Secondary | ICD-10-CM | POA: Diagnosis not present

## 2023-07-30 ENCOUNTER — Ambulatory Visit
Admission: RE | Admit: 2023-07-30 | Discharge: 2023-07-30 | Disposition: A | Discharge status: Home / Self Care | Payer: Medicare PPO | Source: Ambulatory Visit | Attending: Family Medicine | Admitting: Family Medicine

## 2023-07-30 DIAGNOSIS — Z1231 Encounter for screening mammogram for malignant neoplasm of breast: Secondary | ICD-10-CM | POA: Diagnosis not present

## 2023-07-30 DIAGNOSIS — E1159 Type 2 diabetes mellitus with other circulatory complications: Secondary | ICD-10-CM | POA: Diagnosis not present

## 2023-08-25 DIAGNOSIS — Z961 Presence of intraocular lens: Secondary | ICD-10-CM | POA: Diagnosis not present

## 2023-08-25 DIAGNOSIS — H02834 Dermatochalasis of left upper eyelid: Secondary | ICD-10-CM | POA: Diagnosis not present

## 2023-08-25 DIAGNOSIS — H02831 Dermatochalasis of right upper eyelid: Secondary | ICD-10-CM | POA: Diagnosis not present

## 2023-08-25 DIAGNOSIS — H43813 Vitreous degeneration, bilateral: Secondary | ICD-10-CM | POA: Diagnosis not present

## 2023-08-25 DIAGNOSIS — E119 Type 2 diabetes mellitus without complications: Secondary | ICD-10-CM | POA: Diagnosis not present

## 2023-09-17 ENCOUNTER — Other Ambulatory Visit: Payer: Self-pay | Admitting: Internal Medicine

## 2023-09-17 DIAGNOSIS — E119 Type 2 diabetes mellitus without complications: Secondary | ICD-10-CM

## 2023-10-04 ENCOUNTER — Encounter: Payer: Self-pay | Admitting: Internal Medicine

## 2023-10-04 ENCOUNTER — Ambulatory Visit: Payer: Medicare PPO | Admitting: Internal Medicine

## 2023-10-04 VITALS — BP 122/78 | HR 58 | Ht 60.0 in | Wt 154.6 lb

## 2023-10-04 DIAGNOSIS — Z7985 Long-term (current) use of injectable non-insulin antidiabetic drugs: Secondary | ICD-10-CM | POA: Diagnosis not present

## 2023-10-04 DIAGNOSIS — E1165 Type 2 diabetes mellitus with hyperglycemia: Secondary | ICD-10-CM

## 2023-10-04 DIAGNOSIS — Z7984 Long term (current) use of oral hypoglycemic drugs: Secondary | ICD-10-CM

## 2023-10-04 DIAGNOSIS — E7849 Other hyperlipidemia: Secondary | ICD-10-CM

## 2023-10-04 LAB — POCT GLYCOSYLATED HEMOGLOBIN (HGB A1C): Hemoglobin A1C: 6 % — AB (ref 4.0–5.6)

## 2023-10-04 MED ORDER — METFORMIN HCL ER 500 MG PO TB24: 500.0000 mg | ORAL_TABLET | Freq: Two times a day (BID) | ORAL | 3 refills | Status: DC

## 2023-10-04 NOTE — Patient Instructions (Addendum)
Please reduce: - Metformin ER 500 mg 2x a day, with meals  Continue: - Mounjaro 5 mg weekly   Please return in 6 months.

## 2023-10-04 NOTE — Addendum Note (Signed)
Addended by: Pollie Meyer on: 10/04/2023 10:13 AM   Modules accepted: Orders

## 2023-10-04 NOTE — Progress Notes (Signed)
Patient ID: Margaret Zhang, female   DOB: [birth year], 77 y.o.   MRN: [id number]  HPI: Margaret Zhang is a 77 y.o.-year-old female, returning for follow-up for DM2, dx in 2002, non-insulin-dependent, controlled, without long-term complications. Pt. previously saw Dr. Everardo All, but last visit with me 6 months ago. She recently established care with Jorge Ny, FNP, after Dr. Shirlean Mylar left.  Interim history: No increased urination, blurry vision, nausea, chest pain.    Reviewed HbA1c: Lab Results  Component Value Date   HGBA1C 6.1 (A) 04/06/2023   HGBA1C 6.6 (A) 03/02/2022   HGBA1C 7.0 (A) 11/25/2021   HGBA1C 7.0 (A) 09/25/2021  07/24/2022: HbA1c 6.1%  Pt is on a regimen of: - Metformin ER 1000 mg 2x a day, with meals - Mounjaro 5 mg weekly - started 06/2021 Previously on sulfonylurea (glipizide). Previously on Januvia.  Pt checks her sugars  1x a day and they are: - am: 89-123 >> 78-119 >> 89-101 - 2h after b'fast: 113 >> n/c - before lunch: n/c - 2h after lunch: 120s >> n/c - before dinner: n/c - 2h after dinner: n/c >> <120 - bedtime: n/c - nighttime: n/c >> 84 >> n/c Lowest sugar was 89 >> 78 >> 90.  Highest sugar was 128 >> 119 >> 120.  Glucometer: TruMetrix  - no CKD, last BUN/creatinine:  07/29/2023: 13/0.55, GFR 93, glucose 106, ACR 21.5 07/24/2022: 19/0.62, GFR 91, ACR <20.6 Lab Results  Component Value Date   BUN 14 12/10/2018   BUN 10 11/26/2012   CREATININE 0.77 12/10/2018   CREATININE 0.56 11/26/2012  On lisinopril 20 mg daily.  - + HL; last set of lipids: 07/29/2023: 168/94/56/95 07/24/2022: 150/75/57/78 No results found for: "CHOL", "HDL", "LDLCALC", "LDLDIRECT", "TRIG", "CHOLHDL" On Lipitor 80 mg daily.  - last eye exam was on 08/25/2023. No DR reportedly. Dr. Marchelle Gearing. She is seen every 6 mo.  Had cataracts sxs.  - no numbness and tingling in her feet. Last foot exam 04/06/2023.  She also has a history of HTN, iron deficiency anemia, GERD,  osteoarthritis, anxiety, and vitamin D deficiency.  ROS: + see HPI  Past Medical History:  Diagnosis Date   Anemia    Anxiety    Balance problems    Basal cell carcinoma    Colon polyp    DEF    basal cell of the face   Diabetes mellitus without complication (HCC)    type 2   GERD (gastroesophageal reflux disease)    Hearing loss    Heart murmur    Hyperlipidemia    Hypertension    Iron deficiency anemia    Obesity    Osteoarthritis    hands -.Dr Merlyn Lot   Osteoarthrosis, unspecified whether generalized or localized, hand    Pure hypercholesterolemia    Vitamin D deficiency    Past Surgical History:  Procedure Laterality Date    breast biopsy     benign cyst - 1981   ABDOMINAL HYSTERECTOMY     APPENDECTOMY     BREAST SURGERY     KNEE SURGERY  2011   left -arthroscopy   TOTAL ABDOMINAL HYSTERECTOMY  1977   unilateral due to endometriosis   TOTAL KNEE ARTHROPLASTY  11/23/2012   Procedure: TOTAL KNEE ARTHROPLASTY;  Surgeon: Jacki Cones, MD;  Location: [facility];  Service: Orthopedics;  Laterality: Left;   Social History   Socioeconomic History   Marital status: Widowed    Spouse name: Not on file  Number of children: Not on file   Years of education: Not on file   Highest education level: Not on file  Occupational History   Not on file  Tobacco Use   Smoking status: Never   Smokeless tobacco: Never  Vaping Use   Vaping status: Never Used  Substance and Sexual Activity   Alcohol use: No   Drug use: No   Sexual activity: Not on file  Other Topics Concern   Not on file  Social History Narrative   Not on file   Social Determinants of Health   Financial Resource Strain: Not on file  Food Insecurity: Not on file  Transportation Needs: Not on file  Physical Activity: Not on file  Stress: Not on file  Social Connections: Not on file  Intimate Partner Violence: Not on file   Current Outpatient Medications on File Prior to Visit  Medication Sig  Dispense Refill   amLODipine (NORVASC) 5 MG tablet Take 5 mg by mouth daily before breakfast.     aspirin EC 81 MG tablet Take 1 tablet (81 mg total) by mouth daily. Swallow whole. 90 tablet 3   atorvastatin (LIPITOR) 80 MG tablet Take 80 mg by mouth at bedtime.     lisinopril-hydrochlorothiazide (ZESTORETIC) 20-12.5 MG tablet Take 1 tablet by mouth daily before breakfast.     metFORMIN (GLUCOPHAGE-XR) 500 MG 24 hr tablet Take 2 tablets (1,000 mg total) by mouth 2 (two) times daily. 2 tablets in the morning , 2 tablets at bedtime 360 tablet 3   Multiple Vitamin (MULTIVITAMIN WITH MINERALS) TABS Take 1 tablet by mouth daily.     naproxen sodium (ALEVE) 220 MG tablet Take 220 mg by mouth every 12 (twelve) hours as needed (arthritis pain).      omeprazole (PRILOSEC OTC) 20 MG tablet Take 20 mg by mouth daily.     ondansetron (ZOFRAN) 4 MG tablet Take 1 tablet (4 mg total) by mouth every 6 (six) hours as needed. for nausea 30 tablet 2   tirzepatide (MOUNJARO) 5 MG/0.5ML Pen INJECT 5 MG SUBCUTANEOUSLY WEEKLY 6 mL 3   Vitamin D, Ergocalciferol, (DRISDOL) 50000 UNITS CAPS Take 50,000 Units by mouth every 7 (seven) days. On Wednesday     No current facility-administered medications on file prior to visit.   Allergies  Allergen Reactions   Oxycodone Other (See Comments)    Hallucinations   Morphine And Codeine Itching    Felt like bugs were crawling on her   Family History  Problem Relation Age of Onset   Lung cancer Father    Hiatal hernia Mother    Hypertension Mother    Dementia Mother    Hypertension Brother    Diabetes Brother    Healthy Brother    PE: BP 122/78   Pulse (!) 58   Ht 5' (1.524 m)   Wt 154 lb 9.6 oz (70.1 kg)   SpO2 97%   BMI 30.19 kg/m  Wt Readings from Last 3 Encounters:  10/04/23 154 lb 9.6 oz (70.1 kg)  04/06/23 151 lb 6.4 oz (68.7 kg)  09/21/22 147 lb 6.4 oz (66.9 kg)   Constitutional: normal weight, in NAD Eyes: no exophthalmos ENT: no thyromegaly, no  cervical lymphadenopathy Cardiovascular: RRR, + 2/6 SEM, no RG Respiratory: CTA B Musculoskeletal: no deformities Skin: no rashes Neurological: no tremor with outstretched hands  ASSESSMENT: 1. DM2, non-insulin-dependent, controlled, without long-term complications, but with hyperglycemia  2. HL  PLAN:  1. Patient with longstanding, uncontrolled, type  2 diabetes, on oral antidiabetic regimen with metformin and also weekly GLP-1/GIP receptor agonist, with good control.  At last visit, HbA1c was 6.1%, stable. -She lost approximately 60 pounds after starting Mounjaro.  Before last visit she gained 4 pounds back.  She had some nausea and we discussed that both metformin and Mounjaro could have caused this.  I did advise her to skip the metformin at night for few days to see if she still had symptoms.  I advised her to let me know.  Otherwise we did not change the regimen at last visit. -At today's visit, sugars are all at goal.  She has no hyper or hypoglycemia.  She is interested in reducing the dose of metformin, if possible.  We discussed that we can reduce the dose to 50%, by taking 1 tablet in the morning and 1 tablet at night.  I sent a new prescription to her pharmacy.  We will continue Mounjaro at the same dose for now - I suggested to:  Patient Instructions  Please continue: - Metformin ER 1000 mg 2x a day, with meals - Mounjaro 5 mg weekly   Please return in 6 months.  - we checked her HbA1c: 6.0% (lower) - advised to check sugars at different times of the day - 1x a day, rotating check times - advised for yearly eye exams >> she is UTD - return to clinic in 6 months  2. HL - Reviewed latest lipid panel from 07/29/2023: 168/94/56/95 -fractions at goal No results found for: "CHOL", "HDL", "LDLCALC", "LDLDIRECT", "TRIG", "CHOLHDL" - Continues Lipitor 80 mg daily without side effects.  Carlus Pavlov, MD PhD [facility]

## 2023-11-20 DIAGNOSIS — Z23 Encounter for immunization: Secondary | ICD-10-CM | POA: Diagnosis not present

## 2024-03-05 ENCOUNTER — Other Ambulatory Visit: Payer: Self-pay | Admitting: Internal Medicine

## 2024-04-03 ENCOUNTER — Ambulatory Visit: Payer: Medicare PPO | Admitting: Internal Medicine

## 2024-04-03 ENCOUNTER — Encounter: Payer: Self-pay | Admitting: Internal Medicine

## 2024-04-03 VITALS — BP 128/60 | HR 67 | Ht 60.0 in | Wt 151.0 lb

## 2024-04-03 DIAGNOSIS — Z7984 Long term (current) use of oral hypoglycemic drugs: Secondary | ICD-10-CM

## 2024-04-03 DIAGNOSIS — Z7985 Long-term (current) use of injectable non-insulin antidiabetic drugs: Secondary | ICD-10-CM

## 2024-04-03 DIAGNOSIS — E7849 Other hyperlipidemia: Secondary | ICD-10-CM

## 2024-04-03 DIAGNOSIS — E1165 Type 2 diabetes mellitus with hyperglycemia: Secondary | ICD-10-CM | POA: Diagnosis not present

## 2024-04-03 LAB — POCT GLYCOSYLATED HEMOGLOBIN (HGB A1C): Hemoglobin A1C: 6.4 % — AB (ref 4.0–5.6)

## 2024-04-03 NOTE — Progress Notes (Signed)
 Patient ID: Margaret Zhang, female   DOB: [birth year], 78 y.o.   MRN: [id number]  HPI: Margaret Zhang is a 78 y.o.-year-old female, returning for follow-up for DM2, dx in 2002, non-insulin-dependent, controlled, without long-term complications. Pt. previously saw Dr. Everardo All, but last visit with me 6 months ago. She recently established care with Margaret Ny, FNP, after Dr. Shirlean Mylar left.  Interim history: No increased urination, blurry vision, nausea, chest pain.  She has occasional nausea.  Reviewed HbA1c: Lab Results  Component Value Date   HGBA1C 6.0 (A) 10/04/2023   HGBA1C 6.1 (A) 04/06/2023   HGBA1C 6.6 (A) 03/02/2022   HGBA1C 7.0 (A) 11/25/2021   HGBA1C 7.0 (A) 09/25/2021  07/24/2022: HbA1c 6.1%  Pt is on a regimen of: - Metformin ER 1000 >> 500 mg 2x a day, with meals - Mounjaro 5 mg weekly - started 06/2021 Previously on sulfonylurea (glipizide). Previously on Januvia.  Pt checks her sugars  1x a day and they are: - am: 89-123 >> 78-119 >> 89-101 >> 84-108 - 2h after b'fast: 113 >> n/c - before lunch: n/c - 2h after lunch: 120s >> n/c - before dinner: n/c - 2h after dinner: n/c >> <120 >> 110-120 - bedtime: n/c - nighttime: n/c >> 84 >> 84 Lowest sugar was 89 >> 78 >> 90 >> 96.  Highest sugar was 128 >> 119 >> 120.  Glucometer: TruMetrix  - no CKD, last BUN/creatinine:  07/29/2023: 13/0.55, GFR 93, glucose 106, ACR 21.5 07/24/2022: 19/0.62, GFR 91, ACR <20.6 Lab Results  Component Value Date   BUN 14 12/10/2018   BUN 10 11/26/2012   CREATININE 0.77 12/10/2018   CREATININE 0.56 11/26/2012  On lisinopril 20 mg daily.  - + HL; last set of lipids: 07/29/2023: 168/94/56/95 07/24/2022: 150/75/57/78 No results found for: "CHOL", "HDL", "LDLCALC", "LDLDIRECT", "TRIG", "CHOLHDL" On Lipitor 80 mg daily.  - last eye exam was on 08/25/2023. No DR reportedly. Dr. Marchelle Gearing. She is seen every 6 mo.  Had cataracts sxs.  - no numbness and tingling in her feet.  Last foot exam 04/06/2023.  She also has a history of HTN, iron deficiency anemia, GERD, osteoarthritis, anxiety, and vitamin D deficiency.  ROS: + see HPI  Past Medical History:  Diagnosis Date   Anemia    Anxiety    Balance problems    Basal cell carcinoma    Colon polyp    DEF    basal cell of the face   Diabetes mellitus without complication (HCC)    type 2   GERD (gastroesophageal reflux disease)    Hearing loss    Heart murmur    Hyperlipidemia    Hypertension    Iron deficiency anemia    Obesity    Osteoarthritis    hands -.Dr Merlyn Lot   Osteoarthrosis, unspecified whether generalized or localized, hand    Pure hypercholesterolemia    Vitamin D deficiency    Past Surgical History:  Procedure Laterality Date    breast biopsy     benign cyst - 1981   ABDOMINAL HYSTERECTOMY     APPENDECTOMY     BREAST SURGERY     KNEE SURGERY  2011   left -arthroscopy   TOTAL ABDOMINAL HYSTERECTOMY  1977   unilateral due to endometriosis   TOTAL KNEE ARTHROPLASTY  11/23/2012   Procedure: TOTAL KNEE ARTHROPLASTY;  Surgeon: Jacki Cones, MD;  Location: [facility];  Service: Orthopedics;  Laterality: Left;   Social History  Socioeconomic History   Marital status: Widowed    Spouse name: Not on file   Number of children: Not on file   Years of education: Not on file   Highest education level: Not on file  Occupational History   Not on file  Tobacco Use   Smoking status: Never   Smokeless tobacco: Never  Vaping Use   Vaping status: Never Used  Substance and Sexual Activity   Alcohol use: No   Drug use: No   Sexual activity: Not on file  Other Topics Concern   Not on file  Social History Narrative   Not on file   Social Drivers of Health   Financial Resource Strain: Not on file  Food Insecurity: Not on file  Transportation Needs: Not on file  Physical Activity: Not on file  Stress: Not on file  Social Connections: Not on file  Intimate Partner Violence: Not on  file   Current Outpatient Medications on File Prior to Visit  Medication Sig Dispense Refill   amLODipine (NORVASC) 5 MG tablet Take 5 mg by mouth daily before breakfast.     aspirin EC 81 MG tablet Take 1 tablet (81 mg total) by mouth daily. Swallow whole. 90 tablet 3   atorvastatin (LIPITOR) 80 MG tablet Take 80 mg by mouth at bedtime.     lisinopril-hydrochlorothiazide (ZESTORETIC) 20-12.5 MG tablet Take 1 tablet by mouth daily before breakfast.     metFORMIN (GLUCOPHAGE-XR) 500 MG 24 hr tablet TAKE 1 TABLET 2 (TWO) TIMES DAILY WITH A MEAL. 2 TABLETS IN THE MORNING , 2 TABLETS AT BEDTIME 360 tablet 1   Multiple Vitamin (MULTIVITAMIN WITH MINERALS) TABS Take 1 tablet by mouth daily.     naproxen sodium (ALEVE) 220 MG tablet Take 220 mg by mouth every 12 (twelve) hours as needed (arthritis pain).      omeprazole (PRILOSEC OTC) 20 MG tablet Take 20 mg by mouth daily.     ondansetron (ZOFRAN) 4 MG tablet Take 1 tablet (4 mg total) by mouth every 6 (six) hours as needed. for nausea 30 tablet 2   tirzepatide (MOUNJARO) 5 MG/0.5ML Pen INJECT 5 MG SUBCUTANEOUSLY WEEKLY 6 mL 3   Vitamin D, Ergocalciferol, (DRISDOL) 50000 UNITS CAPS Take 50,000 Units by mouth every 7 (seven) days. On Wednesday     No current facility-administered medications on file prior to visit.   Allergies  Allergen Reactions   Oxycodone Other (See Comments)    Hallucinations   Morphine And Codeine Itching    Felt like bugs were crawling on her   Family History  Problem Relation Age of Onset   Lung cancer Father    Hiatal hernia Mother    Hypertension Mother    Dementia Mother    Hypertension Brother    Diabetes Brother    Healthy Brother    PE: BP 128/60   Pulse 67   Ht 5' (1.524 m)   Wt 151 lb (68.5 kg)   SpO2 97%   BMI 29.49 kg/m  Wt Readings from Last 3 Encounters:  04/03/24 151 lb (68.5 kg)  10/04/23 154 lb 9.6 oz (70.1 kg)  04/06/23 151 lb 6.4 oz (68.7 kg)   Constitutional: normal weight, in  NAD Eyes: no exophthalmos ENT: no thyromegaly, no cervical lymphadenopathy Cardiovascular: RRR, + 2/6 SEM, no RG Respiratory: CTA B Musculoskeletal: no deformities Skin: no rashes Neurological: no tremor with outstretched hands Diabetic Foot Exam - Simple   Simple Foot Form Diabetic Foot exam was  performed with the following findings: Yes 04/03/2024  9:40 AM  Visual Inspection No deformities, no ulcerations, no other skin breakdown bilaterally: Yes Sensation Testing Intact to touch and monofilament testing bilaterally: Yes Pulse Check Posterior Tibialis and Dorsalis pulse intact bilaterally: Yes Comments    ASSESSMENT: 1. DM2, non-insulin-dependent, controlled, without long-term complications, but with hyperglycemia  2. HL  PLAN:  1. Patient with longstanding, uncontrolled, type 2 diabetes, on oral antidiabetic regimen with metformin and also weekly GLP-1/GIP receptor agonist, with good control.  At last visit, HbA1c was lower, at 6.0%.  Sugars were all at goal.  She was interested in reducing the dose of metformin and we discussed that she could take only 1 tablet twice a day.  We continued the same dose of Mounjaro. - at today's visit, sugars are well controlled but HbA1c is slightly higher, possibly due to the reduced dose of metformin.  However, based on the sugar values in the last month, no need to change her regimen for now. - I suggested to:  Patient Instructions  Please continue: - Metformin ER 500 mg 2x a day, with meals - Mounjaro 5 mg weekly   Please return in 4-6 months.  - we checked her HbA1c: 6.4% (slightly higher) - advised to check sugars at different times of the day - 1x a day, rotating check times - advised for yearly eye exams >> she is UTD - return to clinic in 4-6 months  2. HL - Lipid fractions were at goal in 07/2023: 168/94/56/95 No results found for: "CHOL", "HDL", "LDLCALC", "LDLDIRECT", "TRIG", "CHOLHDL" - She continues on Lipitor 80 mg daily  without side effects  Carlus Pavlov, MD PhD [facility]

## 2024-04-03 NOTE — Patient Instructions (Addendum)
 Please continue: - Metformin ER 500 mg 2x a day, with meals - Mounjaro 5 mg weekly   Please return in 4-6 months.

## 2024-06-13 DIAGNOSIS — K219 Gastro-esophageal reflux disease without esophagitis: Secondary | ICD-10-CM | POA: Diagnosis not present

## 2024-06-13 DIAGNOSIS — K5900 Constipation, unspecified: Secondary | ICD-10-CM | POA: Diagnosis not present

## 2024-06-13 DIAGNOSIS — Z86018 Personal history of other benign neoplasm: Secondary | ICD-10-CM | POA: Diagnosis not present

## 2024-06-29 ENCOUNTER — Other Ambulatory Visit: Payer: Self-pay | Admitting: Family Medicine

## 2024-06-29 DIAGNOSIS — Z1231 Encounter for screening mammogram for malignant neoplasm of breast: Secondary | ICD-10-CM

## 2024-07-15 DIAGNOSIS — N390 Urinary tract infection, site not specified: Secondary | ICD-10-CM | POA: Diagnosis not present

## 2024-07-15 DIAGNOSIS — R300 Dysuria: Secondary | ICD-10-CM | POA: Diagnosis not present

## 2024-08-04 ENCOUNTER — Ambulatory Visit

## 2024-08-10 ENCOUNTER — Ambulatory Visit

## 2024-08-11 DIAGNOSIS — E1159 Type 2 diabetes mellitus with other circulatory complications: Secondary | ICD-10-CM | POA: Diagnosis not present

## 2024-08-11 DIAGNOSIS — Z87440 Personal history of urinary (tract) infections: Secondary | ICD-10-CM | POA: Diagnosis not present

## 2024-08-11 DIAGNOSIS — E7800 Pure hypercholesterolemia, unspecified: Secondary | ICD-10-CM | POA: Diagnosis not present

## 2024-08-11 DIAGNOSIS — I700 Atherosclerosis of aorta: Secondary | ICD-10-CM | POA: Diagnosis not present

## 2024-08-11 DIAGNOSIS — I10 Essential (primary) hypertension: Secondary | ICD-10-CM | POA: Diagnosis not present

## 2024-08-11 DIAGNOSIS — Z0000 Encounter for general adult medical examination without abnormal findings: Secondary | ICD-10-CM | POA: Diagnosis not present

## 2024-08-11 DIAGNOSIS — R8290 Unspecified abnormal findings in urine: Secondary | ICD-10-CM | POA: Diagnosis not present

## 2024-08-11 DIAGNOSIS — M2550 Pain in unspecified joint: Secondary | ICD-10-CM | POA: Diagnosis not present

## 2024-08-11 DIAGNOSIS — H9190 Unspecified hearing loss, unspecified ear: Secondary | ICD-10-CM | POA: Diagnosis not present

## 2024-08-11 DIAGNOSIS — N390 Urinary tract infection, site not specified: Secondary | ICD-10-CM | POA: Diagnosis not present

## 2024-08-15 LAB — MICROALBUMIN / CREATININE URINE RATIO: Microalb Creat Ratio: 72

## 2024-08-29 ENCOUNTER — Other Ambulatory Visit: Payer: Self-pay | Admitting: Endocrinology

## 2024-08-31 ENCOUNTER — Ambulatory Visit
Admission: RE | Admit: 2024-08-31 | Discharge: 2024-08-31 | Disposition: A | Discharge status: Home / Self Care | Source: Ambulatory Visit | Attending: Family Medicine | Admitting: Family Medicine

## 2024-08-31 DIAGNOSIS — Z1231 Encounter for screening mammogram for malignant neoplasm of breast: Secondary | ICD-10-CM

## 2024-09-04 ENCOUNTER — Ambulatory Visit: Admitting: Internal Medicine

## 2024-09-08 ENCOUNTER — Ambulatory Visit: Admitting: Internal Medicine

## 2024-09-08 ENCOUNTER — Encounter: Payer: Self-pay | Admitting: Internal Medicine

## 2024-09-08 DIAGNOSIS — Z7984 Long term (current) use of oral hypoglycemic drugs: Secondary | ICD-10-CM | POA: Diagnosis not present

## 2024-09-08 DIAGNOSIS — E1165 Type 2 diabetes mellitus with hyperglycemia: Secondary | ICD-10-CM

## 2024-09-08 DIAGNOSIS — E119 Type 2 diabetes mellitus without complications: Secondary | ICD-10-CM

## 2024-09-08 MED ORDER — MOUNJARO 5 MG/0.5ML ~~LOC~~ SOAJ: SUBCUTANEOUS | 3 refills | Status: AC

## 2024-09-08 MED ORDER — METFORMIN HCL ER 500 MG PO TB24: 500.0000 mg | ORAL_TABLET | Freq: Two times a day (BID) | ORAL | 3 refills | Status: AC

## 2024-09-08 NOTE — Patient Instructions (Signed)
 Please continue: - Metformin ER 500 mg 2x a day, with meals - Mounjaro 5 mg weekly   Please return in 4-6 months.

## 2024-09-08 NOTE — Progress Notes (Signed)
 Patient ID: Margaret Zhang, female   DOB: 1946-04-19, 78 y.o.   MRN: [id number]  HPI: Margaret Zhang is a 78 y.o.-year-old female, returning for follow-up for DM2, dx in 2002, non-insulin -dependent, controlled, without long-term complications. Pt. previously saw Dr. Kassie, but last visit with me 5 months ago.  Interim history: No increased urination, blurry vision, nausea, chest pain.   Reviewed HbA1c: 08/15/2024: HbA1c 5.0% Lab Results  Component Value Date   HGBA1C 6.4 (A) 04/03/2024   HGBA1C 6.0 (A) 10/04/2023   HGBA1C 6.1 (A) 04/06/2023   HGBA1C 6.6 (A) 03/02/2022   HGBA1C 7.0 (A) 11/25/2021   HGBA1C 7.0 (A) 09/25/2021  07/24/2022: HbA1c 6.1%  Pt is on a regimen of: - Metformin  ER 1000 >> 500 mg 2x a day, with meals - Mounjaro  5 mg weekly - started 06/2021 Previously on sulfonylurea (glipizide ). Previously on Januvia.  Pt checks her sugars  1x a day and they are: - am: 89-123 >> 78-119 >> 89-101 >> 84-108 >> 82-104 - 2h after b'fast: 113 >> n/c - before lunch: n/c - 2h after lunch: 120s >> n/c - before dinner: n/c - 2h after dinner: n/c >> <120 >> 110-120 >> up to 124 - bedtime: n/c - nighttime: n/c >> 84 >> 84 Lowest sugar was 89 >> 78 >> 90 >> 96 >> 82. Highest sugar was 128 >> 119 >> 120 >> 124.  Glucometer: TruMetrix  - no CKD, last BUN/creatinine:  08/15/2024: 11/0.55, GFR 93, ACR 73 (UTI), glucose 88 07/29/2023: 13/0.55, GFR 93, glucose 106, ACR 21.5 07/24/2022: 19/0.62, GFR 91, ACR <20.6 Lab Results  Component Value Date   BUN 14 12/10/2018   BUN 10 11/26/2012   CREATININE 0.77 12/10/2018   CREATININE 0.56 11/26/2012  No results found for: MICRALBCREAT On lisinopril  20 mg daily.  - + HL; last set of lipids: 08/15/2024: 138/56/56/69 07/29/2023: 168/94/56/95 07/24/2022: 150/75/57/78 No results found for: CHOL, HDL, LDLCALC, LDLDIRECT, TRIG, CHOLHDL On Lipitor  80 mg daily.  - last eye exam was in 2025. No DR reportedly. Dr. Medford Gaudy.  She is seen every 6 mo.  Had cataracts sxs.  - no numbness and tingling in her feet. Last foot exam 04/03/2024.  She also has a history of HTN, iron deficiency anemia, GERD, osteoarthritis, anxiety, and vitamin D  deficiency.  ROS: + see HPI  Past Medical History:  Diagnosis Date   Anemia    Anxiety    Balance problems    Basal cell carcinoma    Colon polyp    DEF    basal cell of the face   Diabetes mellitus without complication (HCC)    type 2   GERD (gastroesophageal reflux disease)    Hearing loss    Heart murmur    Hyperlipidemia    Hypertension    Iron deficiency anemia    Obesity    Osteoarthritis    hands -.Dr Murrell   Osteoarthrosis, unspecified whether generalized or localized, hand    Pure hypercholesterolemia    Vitamin D  deficiency    Past Surgical History:  Procedure Laterality Date    breast biopsy     benign cyst - 1981   ABDOMINAL HYSTERECTOMY     APPENDECTOMY     BREAST CYST EXCISION  1981   BREAST SURGERY     KNEE SURGERY  12/28/2009   left -arthroscopy   TOTAL ABDOMINAL HYSTERECTOMY  12/29/1975   unilateral due to endometriosis   TOTAL KNEE ARTHROPLASTY  11/23/2012   Procedure:  TOTAL KNEE ARTHROPLASTY;  Surgeon: Tanda DELENA Heading, MD;  Location: [facility];  Service: Orthopedics;  Laterality: Left;   Social History   Socioeconomic History   Marital status: Widowed    Spouse name: Not on file   Number of children: Not on file   Years of education: Not on file   Highest education level: Not on file  Occupational History   Not on file  Tobacco Use   Smoking status: Never   Smokeless tobacco: Never  Vaping Use   Vaping status: Never Used  Substance and Sexual Activity   Alcohol use: No   Drug use: No   Sexual activity: Not on file  Other Topics Concern   Not on file  Social History Narrative   Not on file   Social Drivers of Health   Financial Resource Strain: Not on file  Food Insecurity: Not on file  Transportation Needs: Not on  file  Physical Activity: Not on file  Stress: Not on file  Social Connections: Not on file  Intimate Partner Violence: Not on file   Current Outpatient Medications on File Prior to Visit  Medication Sig Dispense Refill   amLODipine  (NORVASC ) 5 MG tablet Take 5 mg by mouth daily before breakfast.     aspirin  EC 81 MG tablet Take 1 tablet (81 mg total) by mouth daily. Swallow whole. 90 tablet 3   atorvastatin  (LIPITOR ) 80 MG tablet Take 80 mg by mouth at bedtime.     lisinopril -hydrochlorothiazide  (ZESTORETIC ) 20-12.5 MG tablet Take 1 tablet by mouth daily before breakfast.     metFORMIN  (GLUCOPHAGE -XR) 500 MG 24 hr tablet TAKE 1 TABLET 2 (TWO) TIMES DAILY WITH A MEAL. 2 TABLETS IN THE MORNING , 2 TABLETS AT BEDTIME 360 tablet 1   Multiple Vitamin (MULTIVITAMIN WITH MINERALS) TABS Take 1 tablet by mouth daily.     naproxen sodium (ALEVE) 220 MG tablet Take 220 mg by mouth every 12 (twelve) hours as needed (arthritis pain).      omeprazole  (PRILOSEC  OTC) 20 MG tablet Take 20 mg by mouth daily.     ondansetron  (ZOFRAN ) 4 MG tablet Take 1 tablet (4 mg total) by mouth every 6 (six) hours as needed. for nausea 30 tablet 2   tirzepatide  (MOUNJARO ) 5 MG/0.5ML Pen INJECT 5 MG SUBCUTANEOUSLY WEEKLY 6 mL 3   Vitamin D , Ergocalciferol , (DRISDOL) 50000 UNITS CAPS Take 50,000 Units by mouth every 7 (seven) days. On Wednesday     No current facility-administered medications on file prior to visit.   Allergies  Allergen Reactions   Oxycodone  Other (See Comments)    Hallucinations   Morphine And Codeine Itching    Felt like bugs were crawling on her   Family History  Problem Relation Age of Onset   Hiatal hernia Mother    Hypertension Mother    Dementia Mother    Lung cancer Father    Hypertension Brother    Diabetes Brother    Healthy Brother    BRCA 1/2 Neg Hx    Breast cancer Neg Hx    PE: BP 132/70   Pulse 70   Ht 5' (1.524 m)   Wt 140 lb 6.4 oz (63.7 kg)   SpO2 94%   BMI 27.42 kg/m   Wt Readings from Last 3 Encounters:  09/08/24 140 lb 6.4 oz (63.7 kg)  04/03/24 151 lb (68.5 kg)  10/04/23 154 lb 9.6 oz (70.1 kg)   Constitutional: normal weight, in NAD Eyes: no exophthalmos ENT:  no thyromegaly, no cervical lymphadenopathy Cardiovascular: RRR, + 2/6 SEM, no RG Respiratory: CTA B Musculoskeletal: no deformities Skin: no rashes Neurological: no tremor with outstretched hands Diabetic Foot Exam - Simple   Simple Foot Form Diabetic Foot exam was performed with the following findings: Yes 09/08/2024 10:37 AM  Visual Inspection No deformities, no ulcerations, no other skin breakdown bilaterally: Yes Sensation Testing Intact to touch and monofilament testing bilaterally: Yes Pulse Check Posterior Tibialis and Dorsalis pulse intact bilaterally: Yes Comments    ASSESSMENT: 1. DM2, non-insulin -dependent, controlled, without long-term complications, but with hyperglycemia  2. HL  PLAN:  1. Patient with longstanding, previously uncontrolled type 2 diabetes, now with improved control on metformin  and GLP-1/GIP receptor agonist.  At last visit, HbA1c was 6.4%, slightly higher, but she had another HbA1c obtained last month and this was much lower, at 5.0%. - At last visit, sugars were all at goal, despite reducing the metformin  dose.  We did not change her regimen. - Today's visit, sugars are all at goal in the morning.  She is not usually checking frequently later in the day, but whenever checked, they are at goal.  For now, we can continue the same regimen. - I suggested to:  Patient Instructions  Please continue: - Metformin  ER 500 mg 2x a day, with meals - Mounjaro  5 mg weekly   Please return in 4-6 months.  - advised to check sugars at different times of the day - 1x a day, rotating check times - advised for yearly eye exams >> she is UTD - she had a higher ACR at last check  - she had a UTI then - return to clinic in 4-6 months  2. HL - Latest lipid panel  was reviewed from 08/15/2024: 138/56/56/69, all fractions at goal No results found for: CHOL, HDL, LDLCALC, LDLDIRECT, TRIG, CHOLHDL - Continues on Lipitor  80 mg daily without side effects  Lela Fendt, MD PhD [facility]

## 2025-01-12 LAB — OPHTHALMOLOGY REPORT-SCANNED

## 2025-03-09 ENCOUNTER — Ambulatory Visit: Admitting: Internal Medicine
